# Patient Record
Sex: Male | Born: 1970 | Race: Black or African American | Hispanic: No | Smoking: Never smoker
Health system: Southern US, Community
[De-identification: ages and names within clinical notes are randomized; demographics above are authoritative.]

## PROBLEM LIST (undated history)

## (undated) DIAGNOSIS — G039 Meningitis, unspecified: Secondary | ICD-10-CM

## (undated) DIAGNOSIS — M25512 Pain in left shoulder: Secondary | ICD-10-CM

## (undated) DIAGNOSIS — E785 Hyperlipidemia, unspecified: Secondary | ICD-10-CM

## (undated) DIAGNOSIS — M25511 Pain in right shoulder: Secondary | ICD-10-CM

## (undated) DIAGNOSIS — E119 Type 2 diabetes mellitus without complications: Secondary | ICD-10-CM

## (undated) DIAGNOSIS — I10 Essential (primary) hypertension: Secondary | ICD-10-CM

## (undated) HISTORY — DX: Meningitis, unspecified: G03.9

## (undated) HISTORY — DX: Hyperlipidemia, unspecified: E78.5

## (undated) HISTORY — DX: Type 2 diabetes mellitus without complications: E11.9

## (undated) HISTORY — PX: EYE SURGERY: SHX253

---

## 2013-05-16 DIAGNOSIS — IMO0002 Reserved for concepts with insufficient information to code with codable children: Secondary | ICD-10-CM | POA: Insufficient documentation

## 2013-06-21 DIAGNOSIS — S63639A Sprain of interphalangeal joint of unspecified finger, initial encounter: Secondary | ICD-10-CM | POA: Insufficient documentation

## 2013-07-08 DIAGNOSIS — G039 Meningitis, unspecified: Secondary | ICD-10-CM

## 2013-07-08 HISTORY — DX: Meningitis, unspecified: G03.9

## 2013-11-17 ENCOUNTER — Emergency Department: Payer: Self-pay | Admitting: Emergency Medicine

## 2013-11-17 LAB — CSF CELL COUNT WITH DIFFERENTIAL
CSF TUBE #: 3
CSF Tube #: 1
EOS PCT: 0 %
EOS PCT: 0 %
Lymphocytes: 59 %
Lymphocytes: 67 %
MONOCYTES/MACROPHAGES: 41 %
Monocytes/Macrophages: 33 %
Neutrophils: 0 %
Neutrophils: 0 %
OTHER CELLS: 0 %
Other Cells: 0 %
RBC (CSF): 0 /mm3
RBC (CSF): 23 /mm3
WBC (CSF): 3 /mm3
WBC (CSF): 34 /mm3

## 2013-11-17 LAB — CBC
HCT: 39.2 % — ABNORMAL LOW (ref 40.0–52.0)
HGB: 12.8 g/dL — AB (ref 13.0–18.0)
MCH: 28 pg (ref 26.0–34.0)
MCHC: 32.6 g/dL (ref 32.0–36.0)
MCV: 86 fL (ref 80–100)
PLATELETS: 147 10*3/uL — AB (ref 150–440)
RBC: 4.57 10*6/uL (ref 4.40–5.90)
RDW: 13.4 % (ref 11.5–14.5)
WBC: 9.4 10*3/uL (ref 3.8–10.6)

## 2013-11-17 LAB — URINALYSIS, COMPLETE
BILIRUBIN, UR: NEGATIVE
BLOOD: NEGATIVE
Bacteria: NONE SEEN
Glucose,UR: 50 mg/dL (ref 0–75)
LEUKOCYTE ESTERASE: NEGATIVE
Nitrite: NEGATIVE
PH: 5 (ref 4.5–8.0)
Protein: NEGATIVE
RBC,UR: NONE SEEN /HPF (ref 0–5)
SQUAMOUS EPITHELIAL: NONE SEEN
Specific Gravity: 1.011 (ref 1.003–1.030)
WBC UR: NONE SEEN /HPF (ref 0–5)

## 2013-11-17 LAB — GLUCOSE, CSF: GLUCOSE, CSF: 110 mg/dL — AB (ref 40–75)

## 2013-11-17 LAB — COMPREHENSIVE METABOLIC PANEL
ALBUMIN: 3.2 g/dL — AB (ref 3.4–5.0)
ALT: 19 U/L (ref 12–78)
ANION GAP: 3 — AB (ref 7–16)
Alkaline Phosphatase: 61 U/L
BUN: 11 mg/dL (ref 7–18)
Bilirubin,Total: 0.4 mg/dL (ref 0.2–1.0)
CHLORIDE: 102 mmol/L (ref 98–107)
Calcium, Total: 8.5 mg/dL (ref 8.5–10.1)
Co2: 32 mmol/L (ref 21–32)
Creatinine: 1.2 mg/dL (ref 0.60–1.30)
EGFR (Non-African Amer.): >60
Glucose: 188 mg/dL — ABNORMAL HIGH (ref 65–99)
OSMOLALITY: 278 (ref 275–301)
POTASSIUM: 4 mmol/L (ref 3.5–5.1)
SGOT(AST): 21 U/L (ref 15–37)
SODIUM: 137 mmol/L (ref 136–145)
Total Protein: 7 g/dL (ref 6.4–8.2)

## 2013-11-17 LAB — PROTIME-INR
INR: 1.1
PROTHROMBIN TIME: 14.1 s (ref 11.5–14.7)

## 2013-11-17 LAB — PROTEIN, CSF: Protein, CSF: 42 mg/dL (ref 15–45)

## 2013-11-17 LAB — APTT: Activated PTT: 27 secs (ref 23.6–35.9)

## 2013-11-21 LAB — CSF CULTURE

## 2013-11-22 LAB — CULTURE, BLOOD (SINGLE)

## 2013-12-20 DIAGNOSIS — G039 Meningitis, unspecified: Secondary | ICD-10-CM | POA: Insufficient documentation

## 2014-04-01 DIAGNOSIS — S61019A Laceration without foreign body of unspecified thumb without damage to nail, initial encounter: Secondary | ICD-10-CM | POA: Insufficient documentation

## 2014-04-01 DIAGNOSIS — L84 Corns and callosities: Secondary | ICD-10-CM | POA: Insufficient documentation

## 2014-09-22 ENCOUNTER — Ambulatory Visit (INDEPENDENT_AMBULATORY_CARE_PROVIDER_SITE_OTHER): Payer: 59

## 2014-09-22 ENCOUNTER — Encounter: Payer: Self-pay | Admitting: Podiatry

## 2014-09-22 ENCOUNTER — Ambulatory Visit (INDEPENDENT_AMBULATORY_CARE_PROVIDER_SITE_OTHER): Payer: 59 | Admitting: Podiatry

## 2014-09-22 VITALS — BP 146/94 | HR 76 | Resp 16 | Ht 72.0 in | Wt 190.0 lb

## 2014-09-22 DIAGNOSIS — M79672 Pain in left foot: Secondary | ICD-10-CM | POA: Diagnosis not present

## 2014-09-22 DIAGNOSIS — Q828 Other specified congenital malformations of skin: Secondary | ICD-10-CM

## 2014-09-22 DIAGNOSIS — E119 Type 2 diabetes mellitus without complications: Secondary | ICD-10-CM | POA: Diagnosis not present

## 2014-09-22 DIAGNOSIS — M795 Residual foreign body in soft tissue: Secondary | ICD-10-CM

## 2014-09-22 NOTE — Progress Notes (Signed)
   Subjective:    Patient ID: Nathan Chan, male    DOB: [birth year], 44 y.o.   MRN: [id number]  HPI  44 year old male presents the office today with a painful callus on the bottom of his left foot on the ball of the foot which is present for several months. He presents today with his girlfriend who states that she. He tries to trim the area down. Denies any redness around the area or any drainage. He has diabetic for which she has been for the last 15 years. He denies any history of ulceration, tingling/numbness, claudication symptoms. States his last HbA1c was 12.0. No other complaints at this time.  Review of Systems  Endocrine:       Diabetes   All other systems reviewed and are negative.      Objective:   Physical Exam AAO 3, NAD DP/PT pulses palpable, CRT less than 3 seconds Protective sensation intact with Simms Weinstein monofilament, vibratory sensation intact, Achilles tendon reflex intact. Hyperkeratotic lesion left foot submetatarsal 4. There is tenderness upon direct palpation of this lesion. Upon debridement lesion there is no underlying ulceration, drainage or clinical signs of infection. No other open lesions or pre-ulcer lesions identified bilaterally. No interdigital maceration. No other areas of tenderness to bilateral lower extremities. No overlying edema, erythema, increase in warmth bilaterally. MMT 5/5, ROM WNL No pain with calf compression, swelling, warmth, erythema.       Assessment & Plan:  44 year old male with symptomatic hyperkeratotic lesion left foot -X-ray was obtained and reviewed with the patient. -Treatment options were discussed including alternatives, risks, complications. -Hyperkeratotic lesion sharply debrided 1 without complication/bleeding. -Discussed shoe gear modifications to help offload the area and various pads. -Follow-up in 3 months or sooner if any problems are to arise. In the meantime encouraged to call the office with any questions,  concerns, change in symptoms.

## 2014-09-22 NOTE — Patient Instructions (Signed)
Diabetes and Foot Care Diabetes may cause you to have problems because of poor blood supply (circulation) to your feet and legs. This may cause the skin on your feet to become thinner, break easier, and heal more slowly. Your skin may become dry, and the skin may peel and crack. You may also have nerve damage in your legs and feet causing decreased feeling in them. You may not notice minor injuries to your feet that could lead to infections or more serious problems. Taking care of your feet is one of the most important things you can do for yourself.  HOME CARE INSTRUCTIONS  Wear shoes at all times, even in the house. Do not go barefoot. Bare feet are easily injured.  Check your feet daily for blisters, cuts, and redness. If you cannot see the bottom of your feet, use a mirror or ask someone for help.  Wash your feet with warm water (do not use hot water) and mild soap. Then pat your feet and the areas between your toes until they are completely dry. Do not soak your feet as this can dry your skin.  Apply a moisturizing lotion or petroleum jelly (that does not contain alcohol and is unscented) to the skin on your feet and to dry, brittle toenails. Do not apply lotion between your toes.  Trim your toenails straight across. Do not dig under them or around the cuticle. File the edges of your nails with an emery board or nail file.  Do not cut corns or calluses or try to remove them with medicine.  Wear clean socks or stockings every day. Make sure they are not too tight. Do not wear knee-high stockings since they may decrease blood flow to your legs.  Wear shoes that fit properly and have enough cushioning. To break in new shoes, wear them for just a few hours a day. This prevents you from injuring your feet. Always look in your shoes before you put them on to be sure there are no objects inside.  Do not cross your legs. This may decrease the blood flow to your feet.  If you find a minor scrape,  cut, or break in the skin on your feet, keep it and the skin around it clean and dry. These areas may be cleansed with mild soap and water. Do not cleanse the area with peroxide, alcohol, or iodine.  When you remove an adhesive bandage, be sure not to damage the skin around it.  If you have a wound, look at it several times a day to make sure it is healing.  Do not use heating pads or hot water bottles. They may burn your skin. If you have lost feeling in your feet or legs, you may not know it is happening until it is too late.  Make sure your health care provider performs a complete foot exam at least annually or more often if you have foot problems. Report any cuts, sores, or bruises to your health care provider immediately. SEEK MEDICAL CARE IF:   You have an injury that is not healing.  You have cuts or breaks in the skin.  You have an ingrown nail.  You notice redness on your legs or feet.  You feel burning or tingling in your legs or feet.  You have pain or cramps in your legs and feet.  Your legs or feet are numb.  Your feet always feel cold. SEEK IMMEDIATE MEDICAL CARE IF:   There is increasing redness,   swelling, or pain in or around a wound.  There is a red line that goes up your leg.  Pus is coming from a wound.  You develop a fever or as directed by your health care provider.  You notice a bad smell coming from an ulcer or wound. Document Released: 06/21/2000 Document Revised: 02/24/2013 Document Reviewed: 12/01/2012 [employer] Patient Information 2015 [employer]. This information is not intended to replace advice given to you by your health care provider. Make sure you discuss any questions you have with your health care provider.  

## 2014-09-23 ENCOUNTER — Encounter: Payer: Self-pay | Admitting: Podiatry

## 2014-12-22 ENCOUNTER — Ambulatory Visit: Payer: 59 | Admitting: Podiatry

## 2015-04-17 DIAGNOSIS — M7580 Other shoulder lesions, unspecified shoulder: Secondary | ICD-10-CM | POA: Insufficient documentation

## 2015-05-14 ENCOUNTER — Emergency Department: Payer: Medicaid Other

## 2015-05-14 ENCOUNTER — Emergency Department
Admission: EM | Admit: 2015-05-14 | Discharge: 2015-05-14 | Disposition: A | Discharge status: Home / Self Care | Payer: Medicaid Other | Attending: Emergency Medicine | Admitting: Emergency Medicine

## 2015-05-14 ENCOUNTER — Encounter: Payer: Self-pay | Admitting: Emergency Medicine

## 2015-05-14 DIAGNOSIS — M5442 Lumbago with sciatica, left side: Secondary | ICD-10-CM | POA: Diagnosis not present

## 2015-05-14 DIAGNOSIS — Z7982 Long term (current) use of aspirin: Secondary | ICD-10-CM | POA: Insufficient documentation

## 2015-05-14 DIAGNOSIS — M4306 Spondylolysis, lumbar region: Secondary | ICD-10-CM | POA: Diagnosis not present

## 2015-05-14 DIAGNOSIS — E119 Type 2 diabetes mellitus without complications: Secondary | ICD-10-CM | POA: Diagnosis not present

## 2015-05-14 DIAGNOSIS — Z791 Long term (current) use of non-steroidal anti-inflammatories (NSAID): Secondary | ICD-10-CM | POA: Diagnosis not present

## 2015-05-14 DIAGNOSIS — Z794 Long term (current) use of insulin: Secondary | ICD-10-CM | POA: Diagnosis not present

## 2015-05-14 DIAGNOSIS — M79605 Pain in left leg: Secondary | ICD-10-CM | POA: Diagnosis present

## 2015-05-14 MED ORDER — KETOROLAC TROMETHAMINE 60 MG/2ML IM SOLN
60.0000 mg | Freq: Once | INTRAMUSCULAR | Status: AC
  Administered 2015-05-14: 60 mg via INTRAMUSCULAR
  Filled 2015-05-14: qty 2

## 2015-05-14 MED ORDER — ORPHENADRINE CITRATE 30 MG/ML IJ SOLN
60.0000 mg | INTRAMUSCULAR | Status: AC
  Administered 2015-05-14: 60 mg via INTRAMUSCULAR
  Filled 2015-05-14: qty 2

## 2015-05-14 MED ORDER — CYCLOBENZAPRINE HCL 5 MG PO TABS: 5.0000 mg | ORAL_TABLET | Freq: Three times a day (TID) | ORAL | Status: DC | PRN

## 2015-05-14 MED ORDER — KETOROLAC TROMETHAMINE 10 MG PO TABS: 10.0000 mg | ORAL_TABLET | Freq: Three times a day (TID) | ORAL | Status: DC

## 2015-05-14 MED ORDER — HYDROCODONE-ACETAMINOPHEN 5-325 MG PO TABS: 1.0000 | ORAL_TABLET | Freq: Three times a day (TID) | ORAL | Status: DC | PRN

## 2015-05-14 NOTE — Discharge Instructions (Signed)
Radicular Pain °Radicular pain in either the arm or leg is usually from a bulging or herniated disk in the spine. A piece of the herniated disk may press against the nerves as the nerves exit the spine. This causes pain which is felt at the tips of the nerves down the arm or leg. Other causes of radicular pain may include: °· Fractures. °· Heart disease. °· Cancer. °· An abnormal and usually degenerative state of the nervous system or nerves (neuropathy). °Diagnosis may require CT or MRI scanning to determine the primary cause.  °Nerves that start at the neck (nerve roots) may cause radicular pain in the outer shoulder and arm. It can spread down to the thumb and fingers. The symptoms vary depending on which nerve root has been affected. In most cases radicular pain improves with conservative treatment. Neck problems may require physical therapy, a neck collar, or cervical traction. Treatment may take many weeks, and surgery may be considered if the symptoms do not improve.  °Conservative treatment is also recommended for sciatica. Sciatica causes pain to radiate from the lower back or buttock area down the leg into the foot. Often there is a history of back problems. Most patients with sciatica are better after 2 to 4 weeks of rest and other supportive care. Short term bed rest can reduce the disk pressure considerably. Sitting, however, is not a good position since this increases the pressure on the disk. You should avoid bending, lifting, and all other activities which make the problem worse. Traction can be used in severe cases. Surgery is usually reserved for patients who do not improve within the first months of treatment. °Only take over-the-counter or prescription medicines for pain, discomfort, or fever as directed by your caregiver. Narcotics and muscle relaxants may help by relieving more severe pain and spasm and by providing mild sedation. Cold or massage can give significant relief. Spinal manipulation  is not recommended. It can increase the degree of disc protrusion. Epidural steroid injections are often effective treatment for radicular pain. These injections deliver medicine to the spinal nerve in the space between the protective covering of the spinal cord and back bones (vertebrae). Your caregiver can give you more information about steroid injections. These injections are most effective when given within two weeks of the onset of pain.  °You should see your caregiver for follow up care as recommended. A program for neck and back injury rehabilitation with stretching and strengthening exercises is an important part of management.  °SEEK IMMEDIATE MEDICAL CARE IF: °· You develop increased pain, weakness, or numbness in your arm or leg. °· You develop difficulty with bladder or bowel control. °· You develop abdominal pain. °  °This information is not intended to replace advice given to you by your health care provider. Make sure you discuss any questions you have with your health care provider. °  °Document Released: 08/01/2004 Document Revised: 07/15/2014 Document Reviewed: 01/18/2015 °Elsevier Interactive Patient Education ©2016 Elsevier [employer] ° °Sciatica °Sciatica is pain, weakness, numbness, or tingling along your sciatic nerve. The nerve starts in the lower back and runs down the back of each leg. Nerve damage or certain conditions pinch or put pressure on the sciatic nerve. This causes the pain, weakness, and other discomforts of sciatica. °HOME CARE  °· Only take medicine as told by your doctor. °· Apply ice to the affected area for 20 minutes. Do this 3-4 times a day for the first 48-72 hours. Then try heat in the same   way.  Exercise, stretch, or do your usual activities if these do not make your pain worse.  Go to physical therapy as told by your doctor.  Keep all doctor visits as told.  Do not wear high heels or shoes that are not supportive.  Get a firm mattress if your mattress is too soft  to lessen pain and discomfort. GET HELP RIGHT AWAY IF:   You cannot control when you poop (bowel movement) or pee (urinate).  You have more weakness in your lower back, lower belly (pelvis), butt (buttocks), or legs.  You have redness or puffiness (swelling) of your back.  You have a burning feeling when you pee.  You have pain that gets worse when you lie down.  You have pain that wakes you from your sleep.  Your pain is worse than past pain.  Your pain lasts longer than 4 weeks.  You are suddenly losing weight without reason. MAKE SURE YOU:   Understand these instructions.  Will watch this condition.  Will get help right away if you are not doing well or get worse.   This information is not intended to replace advice given to you by your health care provider. Make sure you discuss any questions you have with your health care provider.   Document Released: 04/02/2008 Document Revised: 03/15/2015 Document Reviewed: 11/03/2011 Elsevier Interactive Patient Education [employer].   Your back pain may be due to an underlying defect in the bones of your lumbar spine. You should take the prescription meds as directed.  Follow-up with your provider or the ortho specialist as referred.  Return to the ED for severe pain, numbness, or disability.

## 2015-05-14 NOTE — ED Notes (Signed)
Pt presents c/o achy pain to the outside of his left leg from hip socket down to ankle. Pt states pain has gotten worse, even with tylenol/motrin. Pt is diabetic. NAD noted.

## 2015-05-14 NOTE — ED Notes (Signed)
States he is having pain from left hip into leg   Ambulates with sl. Limp to triage

## 2015-05-14 NOTE — ED Provider Notes (Signed)
[facility] Emergency Department Provider Note ____________________________________________  Time seen: 1515  I have reviewed the triage vital signs and the nursing notes.  HISTORY  Chief Complaint  Leg Pain  HPI Nathan Chan is a 44 y.o. male reports to the ED for evaluation of management of low back pain with referral down the left leg, that has been ongoing for the last 1-1/2 months. The patient denies any injury, trauma, fall, but notes the pain has acutely worsened in the last 5-7 days. He denies any bladder or bowel incontinence, foot drop, or distal paresthesias. He describes the pain as achy and burning, and localizes it from the right buttocks down the dorsal lateral thigh, and down the posterior left calf. He denies any skin color or temperature changes or history of back problems or diabetic neuropathy. He does note that his pain is increased with standing and walking. He is to pain at a 7/10, and notes that it is constant. He has felt little relief with dosing Tylenol or ibuprofen. He has not been evaluated by his primary care provider for this complaint since the onset.  Past Medical History  Diagnosis Date  . Diabetes mellitus without complication ([place])    There are no active problems to display for this patient.  History reviewed. No pertinent past surgical history.  Current Outpatient Rx  Name  Route  Sig  Dispense  Refill  . aspirin 81 MG tablet   Oral   Take 81 mg by mouth daily.         . cyclobenzaprine (FLEXERIL) 5 MG tablet   Oral   Take 1 tablet (5 mg total) by mouth 3 (three) times daily as needed for muscle spasms.   15 tablet   0   . HYDROcodone-acetaminophen (NORCO) 5-325 MG tablet   Oral   Take 1 tablet by mouth every 8 (eight) hours as needed for moderate pain.   12 tablet   0   . insulin aspart protamine- aspart (NOVOLOG MIX 70/30) (70-30) 100 UNIT/ML injection   Subcutaneous   Inject into the skin.         Marland Kitchen  ketorolac (TORADOL) 10 MG tablet   Oral   Take 1 tablet (10 mg total) by mouth every 8 (eight) hours.   15 tablet   0    Allergies Review of patient's allergies indicates no known allergies.  No family history on file.  Social History Social History  Substance Use Topics  . Smoking status: Never Smoker   . Smokeless tobacco: None  . Alcohol Use: No   Review of Systems  Constitutional: Negative for fever. Eyes: Negative for visual changes. ENT: Negative for sore throat. Cardiovascular: Negative for chest pain. Respiratory: Negative for shortness of breath. Gastrointestinal: Negative for abdominal pain, vomiting and diarrhea. Genitourinary: Negative for dysuria. Musculoskeletal: Positive for back pain with LLE referral as above. Skin: Negative for rash. Neurological: Negative for headaches, focal weakness or numbness. ____________________________________________  PHYSICAL EXAM:  VITAL SIGNS: ED Triage Vitals  Enc Vitals Group     BP 05/14/15 1431 160/92 mmHg     Pulse Rate 05/14/15 1431 77     Resp 05/14/15 1431 18     Temp 05/14/15 1431 97.8 F (36.6 C)     Temp Source 05/14/15 1431 Oral     SpO2 05/14/15 1431 99 %     Weight 05/14/15 1431 190 lb (86.183 kg)     Height 05/14/15 1431 6' (1.829 m)  Head Cir --      Peak Flow --      Pain Score 05/14/15 1401 8     Pain Loc --      Pain Edu? --      Excl. in [place]? --    Constitutional: Alert and oriented. Well appearing and in no distress. Head: Normocephalic and atraumatic.      Eyes: Conjunctivae are normal. PERRL. Normal extraocular movements      Ears: Canals clear. TMs intact bilaterally.   Nose: No congestion/rhinorrhea.   Mouth/Throat: Mucous membranes are moist.   Neck: Supple. No thyromegaly. Hematological/Lymphatic/Immunological: No cervical lymphadenopathy. Cardiovascular: Normal rate, regular rhythm.  Respiratory: Normal respiratory effort. No wheezes/rales/rhonchi. Gastrointestinal:  Soft and nontender. No distention. Musculoskeletal: Normal spinal alignment without midline tenderness, spasm, deformity, or step-off. Patient with normal supine to sit difficulty.  Nontender with normal range of motion in all extremities.  Neurologic:  Normal gait without ataxia. Normal speech and language. No gross focal neurologic deficits are appreciated. Skin:  Skin is warm, dry and intact. No rash noted. Psychiatric: Mood and affect are normal. Patient exhibits appropriate insight and judgment. ____________________________________________   LABS (pertinent positives/negatives)  Labs Reviewed - No data to display  ____________________________________________   RADIOLOGY LS spine  FINDINGS: (per radiologist) Normal anatomic alignment. Preservation of the vertebral body and intervertebral disc space heights. Degenerative disc disease T11-12. No significant facet degenerative changes. No acute osseous Abnormality  On review of films, I find a pars defect at L5 whose age is unclear to me due to no previous imaging.   I, Keveon Amsler, Dannielle Karvonen, personally viewed and evaluated these images (plain radiographs) as part of my medical decision making. ____________________________________________  PROCEDURES  Toradol 60 mg PO Norflex 60 mg PO ____________________________________________  INITIAL IMPRESSION / ASSESSMENT AND PLAN / ED COURSE  Acute radicular lumbar pain with underlying pars defect. Suggest prescription med management with Toradol, Flexeril, adn Norco. Patient to follow-up with primary provider or Dr. Marry Guan for ongoing symptoms. Patient reports pain at 2/10 at discharge. ____________________________________________  FINAL CLINICAL IMPRESSION(S) / ED DIAGNOSES  Final diagnoses:  Left-sided low back pain with left-sided sciatica  Pars defect of lumbar spine      Melvenia Needles, PA-C 05/14/15 [place], MD 05/16/15 [phone]

## 2015-05-18 ENCOUNTER — Encounter: Payer: Self-pay | Admitting: Podiatry

## 2015-05-18 ENCOUNTER — Ambulatory Visit (INDEPENDENT_AMBULATORY_CARE_PROVIDER_SITE_OTHER): Payer: Medicaid Other | Admitting: Podiatry

## 2015-05-18 VITALS — BP 155/98 | HR 88 | Resp 18

## 2015-05-18 DIAGNOSIS — Q828 Other specified congenital malformations of skin: Secondary | ICD-10-CM | POA: Diagnosis not present

## 2015-05-21 ENCOUNTER — Encounter: Payer: Self-pay | Admitting: Podiatry

## 2015-05-21 DIAGNOSIS — Q828 Other specified congenital malformations of skin: Secondary | ICD-10-CM | POA: Insufficient documentation

## 2015-05-21 NOTE — Progress Notes (Addendum)
Patient ID: Nathan Chan, male   DOB: 1971/01/11, 44 y.o.   MRN: [id number]  Subjective: 44 y.o. returns the office today for painful "wart" on the bottom of the left foot which continues to be painful mostly with pressure. Denies any redness or drainage around the area. Denies any acute changes since last appointment and no new complaints today. Denies any systemic complaints such as fevers, chills, nausea, vomiting.   Objective: AAO 3, NAD DP/PT pulses palpable, CRT less than 3 seconds Protective sensation intact with Simms Weinstein monofilament Annular hyperkeratotic lesion left foot submetatarsal 4. Upon debridement there was no pinpoint bleeding and there is no evidence of verruca. This area appears to be a porokeratosis. There is no underlying ulceration, drainage or other signs of infection.  No open lesions or other pre-ulcerative lesions are identified. No other areas of tenderness bilateral lower extremities. No overlying edema, erythema, increased warmth. No pain with calf compression, swelling, warmth, erythema.  Assessment: Patient presents with symptomatic porokeratosis  Plan: -Treatment options including alternatives, risks, complications were discussed -Etiology of symptoms were discussed -Hyperkeratotic lesion sharply debrided 1 without, complications/bleeding. -Offloading pads dispensed.  Discussed this is going to be a likely reoccurring issue unless we can eliminate the pressure.  -Discussed daily foot inspection. If there are any changes, to call the office immediately.  -Follow-up in 3 months or sooner if any problems are to arise. In the meantime, encouraged to call the office with any questions, concerns, changes symptoms.  Celesta Gentile, DPM

## 2015-06-08 DIAGNOSIS — I10 Essential (primary) hypertension: Secondary | ICD-10-CM | POA: Insufficient documentation

## 2015-06-12 DIAGNOSIS — N529 Male erectile dysfunction, unspecified: Secondary | ICD-10-CM | POA: Insufficient documentation

## 2015-06-12 DIAGNOSIS — M24549 Contracture, unspecified hand: Secondary | ICD-10-CM | POA: Insufficient documentation

## 2015-06-15 ENCOUNTER — Emergency Department
Admission: EM | Admit: 2015-06-15 | Discharge: 2015-06-15 | Disposition: A | Discharge status: Home / Self Care | Payer: Medicaid Other | Attending: Emergency Medicine | Admitting: Emergency Medicine

## 2015-06-15 ENCOUNTER — Encounter: Payer: Self-pay | Admitting: *Deleted

## 2015-06-15 DIAGNOSIS — Z791 Long term (current) use of non-steroidal anti-inflammatories (NSAID): Secondary | ICD-10-CM | POA: Insufficient documentation

## 2015-06-15 DIAGNOSIS — Z7951 Long term (current) use of inhaled steroids: Secondary | ICD-10-CM | POA: Insufficient documentation

## 2015-06-15 DIAGNOSIS — Z7982 Long term (current) use of aspirin: Secondary | ICD-10-CM | POA: Diagnosis not present

## 2015-06-15 DIAGNOSIS — E119 Type 2 diabetes mellitus without complications: Secondary | ICD-10-CM | POA: Diagnosis not present

## 2015-06-15 DIAGNOSIS — J029 Acute pharyngitis, unspecified: Secondary | ICD-10-CM | POA: Diagnosis not present

## 2015-06-15 DIAGNOSIS — Z79899 Other long term (current) drug therapy: Secondary | ICD-10-CM | POA: Diagnosis not present

## 2015-06-15 DIAGNOSIS — Z794 Long term (current) use of insulin: Secondary | ICD-10-CM | POA: Diagnosis not present

## 2015-06-15 LAB — POCT RAPID STREP A: Streptococcus, Group A Screen (Direct): NEGATIVE

## 2015-06-15 MED ORDER — IBUPROFEN 800 MG PO TABS
800.0000 mg | ORAL_TABLET | Freq: Once | ORAL | Status: AC
  Administered 2015-06-15: 800 mg via ORAL
  Filled 2015-06-15: qty 1

## 2015-06-15 NOTE — Discharge Instructions (Signed)
Pharyngitis Pharyngitis is redness, pain, and swelling (inflammation) of your pharynx.  CAUSES  Pharyngitis is usually caused by infection. Most of the time, these infections are from viruses (viral) and are part of a cold. However, sometimes pharyngitis is caused by bacteria (bacterial). Pharyngitis can also be caused by allergies. Viral pharyngitis may be spread from person to person by coughing, sneezing, and personal items or utensils (cups, forks, spoons, toothbrushes). Bacterial pharyngitis may be spread from person to person by more intimate contact, such as kissing.  SIGNS AND SYMPTOMS  Symptoms of pharyngitis include:   Sore throat.   Tiredness (fatigue).   Low-grade fever.   Headache.  Joint pain and muscle aches.  Skin rashes.  Swollen lymph nodes.  Plaque-like film on throat or tonsils (often seen with bacterial pharyngitis). DIAGNOSIS  Your health care provider will ask you questions about your illness and your symptoms. Your medical history, along with a physical exam, is often all that is needed to diagnose pharyngitis. Sometimes, a rapid strep test is done. Other lab tests may also be done, depending on the suspected cause.  TREATMENT  Viral pharyngitis will usually get better in 3-4 days without the use of medicine. Bacterial pharyngitis is treated with medicines that kill germs (antibiotics).  HOME CARE INSTRUCTIONS   Drink enough water and fluids to keep your urine clear or pale yellow.   Only take over-the-counter or prescription medicines as directed by your health care provider:   If you are prescribed antibiotics, make sure you finish them even if you start to feel better.   Do not take aspirin.   Get lots of rest.   Gargle with 8 oz of salt water ( tsp of salt per 1 qt of water) as often as every 1-2 hours to soothe your throat.   Throat lozenges (if you are not at risk for choking) or sprays may be used to soothe your throat. SEEK MEDICAL  CARE IF:   You have large, tender lumps in your neck.  You have a rash.  You cough up green, yellow-brown, or bloody spit. SEEK IMMEDIATE MEDICAL CARE IF:   Your neck becomes stiff.  You drool or are unable to swallow liquids.  You vomit or are unable to keep medicines or liquids down.  You have severe pain that does not go away with the use of recommended medicines.  You have trouble breathing (not caused by a stuffy nose). MAKE SURE YOU:   Understand these instructions.  Will watch your condition.  Will get help right away if you are not doing well or get worse.   This information is not intended to replace advice given to you by your health care provider. Make sure you discuss any questions you have with your health care provider.   Document Released: 06/24/2005 Document Revised: 04/14/2013 Document Reviewed: 03/01/2013 Elsevier Interactive Patient Education 2016 [place] ibuprofen as needed for pain. He may also use over-the-counter cold medicines such as mucinex or corcidin.   Follow-up with urgent care if not improving. Return to emergency room for any concerns.

## 2015-06-15 NOTE — ED Notes (Signed)
Pt complains of sore throat since this morning , pt denies any other symptoms

## 2015-06-15 NOTE — ED Provider Notes (Signed)
[facility] Emergency Department Provider Note  ____________________________________________  Time seen: Approximately 3:43 PM  I have reviewed the triage vital signs and the nursing notes.   HISTORY  Chief Complaint Sore Throat    HPI Nathan Chan is a 44 y.o. male who presents with acute sore throat beginning today. He has mild congestion and cough. No known exposure to strep throat. He has mild headache. No current fevers. No shortness of breath or chest pain.   Past Medical History  Diagnosis Date  . Diabetes mellitus without complication [facility])     Patient Active Problem List   Diagnosis Date Noted  . Porokeratosis 05/21/2015    History reviewed. No pertinent past surgical history.  Current Outpatient Rx  Name  Route  Sig  Dispense  Refill  . albuterol (PROAIR HFA) 108 (90 BASE) MCG/ACT inhaler   Inhalation   Inhale into the lungs.         Marland Kitchen aspirin 81 MG tablet   Oral   Take 81 mg by mouth daily.         Marland Kitchen aspirin EC 81 MG tablet   Oral   Take 81 mg by mouth.         Marland Kitchen atorvastatin (LIPITOR) 20 MG tablet   Oral   Take 20 mg by mouth.         . carisoprodol (SOMA) 350 MG tablet   Oral   Take 350 mg by mouth.         . cyclobenzaprine (FLEXERIL) 5 MG tablet   Oral   Take 1 tablet (5 mg total) by mouth 3 (three) times daily as needed for muscle spasms.   15 tablet   0   . HYDROcodone-acetaminophen (NORCO) 5-325 MG tablet   Oral   Take 1 tablet by mouth every 8 (eight) hours as needed for moderate pain.   12 tablet   0   . insulin aspart (NOVOLOG FLEXPEN) 100 UNIT/ML FlexPen      Take 8 units with breakfast and dinner, 4 units at lunch.         . insulin aspart protamine - aspart (NOVOLOG 70/30 MIX) (70-30) 100 UNIT/ML FlexPen      35 units in the am and 15 units in the pm subcutaneous         . insulin aspart protamine- aspart (NOVOLOG MIX 70/30) (70-30) 100 UNIT/ML injection   Subcutaneous   Inject  into the skin.         Marland Kitchen ketorolac (TORADOL) 10 MG tablet   Oral   Take 1 tablet (10 mg total) by mouth every 8 (eight) hours.   15 tablet   0   . lisinopril (PRINIVIL,ZESTRIL) 10 MG tablet   Oral   Take 10 mg by mouth.         . meloxicam (MOBIC) 15 MG tablet   Oral   Take 15 mg by mouth.         . naproxen (NAPROSYN) 500 MG tablet   Oral   Take by mouth.         . traMADol (ULTRAM) 50 MG tablet      1-2 tablets every 6 hours as needed for pain.           Allergies Review of patient's allergies indicates no known allergies.  No family history on file.  Social History Social History  Substance Use Topics  . Smoking status: Never Smoker   . Smokeless tobacco: None  .  Alcohol Use: No    Review of Systems Constitutional: No fever/chills Eyes: No visual changes. ENT: No ear pain or sinus pressure.. Cardiovascular: Denies chest pain. Respiratory: Denies shortness of breath. Gastrointestinal: No abdominal pain.  No nausea, no vomiting.  No diarrhea.  No constipation. Neurological: Negative focal weakness or numbness. 10-point ROS otherwise negative.  ____________________________________________   PHYSICAL EXAM:  VITAL SIGNS: ED Triage Vitals  Enc Vitals Group     BP 06/15/15 1503 133/94 mmHg     Pulse Rate 06/15/15 1503 84     Resp 06/15/15 1503 16     Temp 06/15/15 1503 98.2 F (36.8 C)     Temp Source 06/15/15 1503 Oral     SpO2 06/15/15 1503 97 %     Weight 06/15/15 1503 191 lb (86.637 kg)     Height 06/15/15 1503 6' (1.829 m)     Head Cir --      Peak Flow --      Pain Score 06/15/15 1508 6     Pain Loc --      Pain Edu? --      Excl. in [place]? --     Constitutional: Alert and oriented. Well appearing and in no acute distress. Eyes: Conjunctivae are normal. PERRL. EOMI. Ears:  Clear with normal landmarks. No erythema. Head: Atraumatic. Nose: No congestion/rhinnorhea. Mouth/Throat: Mucous membranes are moist.  Oropharynx  non-erythematous. No lesions. Neck:  Supple.  Cervical and tonsilar adenopathy.   Cardiovascular: Normal rate, regular rhythm. Grossly normal heart sounds.  Good peripheral circulation. Respiratory: Normal respiratory effort.  No retractions. Lungs CTAB. Gastrointestinal: Soft and nontender. No distention. No abdominal bruits. No CVA tenderness. Musculoskeletal: Nml ROM of upper and lower extremity joints. Neurologic:  Normal speech and language. No gross focal neurologic deficits are appreciated. No gait instability. Skin:  Skin is warm, dry and intact. No rash noted. Psychiatric: Mood and affect are normal. Speech and behavior are normal.  ____________________________________________   LABS (all labs ordered are listed, but only abnormal results are displayed)  Labs Reviewed - No data to display ____________________________________________  EKG   ____________________________________________  RADIOLOGY   ____________________________________________   PROCEDURES  Procedure(s) performed: None  Critical Care performed: No  ____________________________________________   INITIAL IMPRESSION / ASSESSMENT AND PLAN / ED COURSE  Pertinent labs & imaging results that were available during my care of the patient were reviewed by me and considered in my medical decision making (see chart for details).  44 year old who presents with 1 day of sore throat. Suspect a viral pharyngitis. Given ibuprofen. May use over-the-counter medicines as needed. Will follow-up with urgent care if not improving. ____________________________________________   FINAL CLINICAL IMPRESSION(S) / ED DIAGNOSES  Final diagnoses:  Pharyngitis      [name], PA-C 06/15/15 1609  Hinda Kehr, MD 06/16/15 [id number]

## 2015-06-18 LAB — CULTURE, GROUP A STREP (THRC)

## 2015-07-20 ENCOUNTER — Emergency Department
Admission: EM | Admit: 2015-07-20 | Discharge: 2015-07-20 | Disposition: A | Discharge status: Home / Self Care | Payer: Medicaid Other | Attending: Emergency Medicine | Admitting: Emergency Medicine

## 2015-07-20 ENCOUNTER — Encounter: Payer: Self-pay | Admitting: *Deleted

## 2015-07-20 DIAGNOSIS — Z79899 Other long term (current) drug therapy: Secondary | ICD-10-CM | POA: Insufficient documentation

## 2015-07-20 DIAGNOSIS — Z791 Long term (current) use of non-steroidal anti-inflammatories (NSAID): Secondary | ICD-10-CM | POA: Diagnosis not present

## 2015-07-20 DIAGNOSIS — E119 Type 2 diabetes mellitus without complications: Secondary | ICD-10-CM | POA: Diagnosis not present

## 2015-07-20 DIAGNOSIS — H538 Other visual disturbances: Secondary | ICD-10-CM | POA: Diagnosis not present

## 2015-07-20 DIAGNOSIS — Z794 Long term (current) use of insulin: Secondary | ICD-10-CM | POA: Insufficient documentation

## 2015-07-20 DIAGNOSIS — H109 Unspecified conjunctivitis: Secondary | ICD-10-CM | POA: Diagnosis not present

## 2015-07-20 DIAGNOSIS — Z7982 Long term (current) use of aspirin: Secondary | ICD-10-CM | POA: Diagnosis not present

## 2015-07-20 DIAGNOSIS — H539 Unspecified visual disturbance: Secondary | ICD-10-CM

## 2015-07-20 DIAGNOSIS — H5711 Ocular pain, right eye: Secondary | ICD-10-CM | POA: Diagnosis present

## 2015-07-20 MED ORDER — TOBRAMYCIN 0.3 % OP SOLN
2.0000 [drp] | OPHTHALMIC | Status: DC
  Filled 2015-07-20: qty 5

## 2015-07-20 MED ORDER — FLUORESCEIN SODIUM 1 MG OP STRP
1.0000 | ORAL_STRIP | Freq: Once | OPHTHALMIC | Status: DC
  Filled 2015-07-20: qty 1

## 2015-07-20 MED ORDER — PROPARACAINE HCL 0.5 % OP SOLN
1.0000 [drp] | Freq: Once | OPHTHALMIC | Status: DC
  Filled 2015-07-20: qty 15

## 2015-07-20 MED ORDER — EYE WASH OPHTH SOLN
OPHTHALMIC | Status: AC
  Filled 2015-07-20: qty 118

## 2015-07-20 MED ORDER — GENTAMICIN SULFATE 0.3 % OP SOLN: 1.0000 [drp] | Freq: Four times a day (QID) | OPHTHALMIC | Status: AC

## 2015-07-20 MED ORDER — TETRACAINE HCL 0.5 % OP SOLN
OPHTHALMIC | Status: AC
  Filled 2015-07-20: qty 2

## 2015-07-20 MED ORDER — EYE WASH OPHTH SOLN: 1.0000 [drp] | OPHTHALMIC | Status: DC | PRN

## 2015-07-20 MED ORDER — ERYTHROMYCIN 5 MG/GM OP OINT
TOPICAL_OINTMENT | Freq: Once | OPHTHALMIC | Status: AC
Start: 2015-07-20 — End: 2015-07-20
  Administered 2015-07-20: 1 via OPHTHALMIC
  Filled 2015-07-20: qty 1

## 2015-07-20 NOTE — ED Notes (Signed)
Pt states right eye pain for 2 days, redness noted to eye, deneis any headache or dizziness

## 2015-07-20 NOTE — ED Notes (Signed)
Assess per PA 

## 2015-07-20 NOTE — ED Provider Notes (Signed)
[facility] Emergency Department Provider Note  ____________________________________________  Time seen: Approximately 5:21 PM  I have reviewed the triage vital signs and the nursing notes.   HISTORY  Chief Complaint Eye Pain    HPI Nathan Chan is a 45 y.o. male sense with 2 days of right eye irritation, and mild blurring of vision.  He reports head congestion for several weeks.  No light sensitivity or severe eye pain.  Able to drive without difficulty.  Has occasional foreign body sensation.  No n/v, HA. No hx of glaucoma. No itching or rash.   Past Medical History  Diagnosis Date  . Diabetes mellitus without complication [facility])     Patient Active Problem List   Diagnosis Date Noted  . Porokeratosis 05/21/2015    History reviewed. No pertinent past surgical history.  Current Outpatient Rx  Name  Route  Sig  Dispense  Refill  . EXPIRED: albuterol (PROAIR HFA) 108 (90 BASE) MCG/ACT inhaler   Inhalation   Inhale into the lungs.         Marland Kitchen aspirin 81 MG tablet   Oral   Take 81 mg by mouth daily.         Marland Kitchen aspirin EC 81 MG tablet   Oral   Take 81 mg by mouth.         Marland Kitchen atorvastatin (LIPITOR) 20 MG tablet   Oral   Take 20 mg by mouth.         . carisoprodol (SOMA) 350 MG tablet   Oral   Take 350 mg by mouth.         . cyclobenzaprine (FLEXERIL) 5 MG tablet   Oral   Take 1 tablet (5 mg total) by mouth 3 (three) times daily as needed for muscle spasms.   15 tablet   0   . HYDROcodone-acetaminophen (NORCO) 5-325 MG tablet   Oral   Take 1 tablet by mouth every 8 (eight) hours as needed for moderate pain.   12 tablet   0   . insulin aspart (NOVOLOG FLEXPEN) 100 UNIT/ML FlexPen      Take 8 units with breakfast and dinner, 4 units at lunch.         . insulin aspart protamine - aspart (NOVOLOG 70/30 MIX) (70-30) 100 UNIT/ML FlexPen      35 units in the am and 15 units in the pm subcutaneous         . insulin aspart  protamine- aspart (NOVOLOG MIX 70/30) (70-30) 100 UNIT/ML injection   Subcutaneous   Inject into the skin.         Marland Kitchen ketorolac (TORADOL) 10 MG tablet   Oral   Take 1 tablet (10 mg total) by mouth every 8 (eight) hours.   15 tablet   0   . lisinopril (PRINIVIL,ZESTRIL) 10 MG tablet   Oral   Take 10 mg by mouth.         . meloxicam (MOBIC) 15 MG tablet   Oral   Take 15 mg by mouth.         . naproxen (NAPROSYN) 500 MG tablet   Oral   Take by mouth.         . traMADol (ULTRAM) 50 MG tablet      1-2 tablets every 6 hours as needed for pain.           Allergies Review of patient's allergies indicates no known allergies.  History reviewed. No pertinent family history.  Social History Social History  Substance Use Topics  . Smoking status: Never Smoker   . Smokeless tobacco: None  . Alcohol Use: No    Review of Systems Constitutional: No fever/chills Eyes: per HPI ENT: No sore throat. Cardiovascular: Denies chest pain. Respiratory: Denies shortness of breath. Gastrointestinal: No abdominal pain.  No nausea, no vomiting.  No diarrhea.  No constipation. Skin: Negative for rash. Neurological: Negative for headaches, focal weakness or numbness. 10-point ROS otherwise negative.  ____________________________________________   PHYSICAL EXAM:  VITAL SIGNS: ED Triage Vitals  Enc Vitals Group     BP 07/20/15 1456 166/107 mmHg     Pulse Rate 07/20/15 1456 89     Resp 07/20/15 1456 18     Temp 07/20/15 1456 98.1 F (36.7 C)     Temp Source 07/20/15 1456 Oral     SpO2 07/20/15 1456 98 %     Weight 07/20/15 1456 195 lb (88.451 kg)     Height 07/20/15 1456 6' (1.829 m)     Head Cir --      Peak Flow --      Pain Score 07/20/15 1457 7     Pain Loc --      Pain Edu? --      Excl. in [place]? --     Constitutional: Alert and oriented. Well appearing and in no acute distress. Eyes: Conjunctivae RIGHT, Mildly injected. PERRL. EOMI. Right eye exam:  With slit  lamp. No abrasions, foreign bodies noted.  Inspected under the lids as well.  Used fluorescein strip with no abrasions noted.  Irrigation performed 64ml.  Also used tetracaine for the exam. Ears:  Clear with normal landmarks. No erythema. Head: Atraumatic. Nose: No congestion/rhinnorhea. Mouth/Throat: Mucous membranes are moist.  Oropharynx non-erythematous. No lesions. Neck:  Supple.  No adenopathy.   Cardiovascular: Normal rate, regular rhythm. Grossly normal heart sounds.  Good peripheral circulation. Respiratory: Normal respiratory effort.  No retractions. Lungs CTAB. Gastrointestinal: Soft and nontender. No distention. No abdominal bruits. No CVA tenderness. Psychiatric: Mood and affect are normal. Speech and behavior are normal.  ____________________________________________   LABS (all labs ordered are listed, but only abnormal results are displayed)  Labs Reviewed - No data to display ____________________________________________  EKG    ____________________________________________  RADIOLOGY   ____________________________________________   PROCEDURES  Procedure(s) performed: see eye evaluation above  Critical Care performed: No  ____________________________________________   INITIAL IMPRESSION / ASSESSMENT AND PLAN / ED COURSE  Pertinent labs & imaging results that were available during my care of the patient were reviewed by me and considered in my medical decision making (see chart for details).  -45 year-old male with mild redness, discomfort and blurring of vision to the right eye.  Normal eye exam except for visual acuity 20/70 in right eye and 20/40 in left.   Instructed in using tobramycin eye drops for possible conjunctivitis and follow up with the ophthalmologist tomorrow for further evaluation if not improving.  He will return the ER for any worsening symptoms such as worsening eye pain or visual loss   He has hx of hypertension taking BP meds BID.   Encouraged taking his BP meds as soon as he gets home. ____________________________________________   FINAL CLINICAL IMPRESSION(S) / ED DIAGNOSES  Final diagnoses:  Vision changes  Conjunctivitis of right eye      [name], PA-C 07/20/15 1734  Harvest Dark, MD 07/20/15 2309

## 2015-07-20 NOTE — Discharge Instructions (Signed)
° ° ° ° °  Visual Disturbances     You have had a disturbance in your vision. This may be caused by various conditions, such as:  Migraines. Migraine headaches are often preceded by a disturbance in vision. Blind spots or light flashes are followed by a headache. This type of visual disturbance is temporary. It does not damage the eye.  Glaucoma. This is caused by increased pressure in the eye. Symptoms include haziness, blurred vision, or seeing rainbow colored circles when looking at bright lights. Partial or complete visual loss can occur. You may or may not experience eye pain. Visual loss may be gradual or sudden and is irreversible. Glaucoma is the leading cause of blindness.  Retina problems. Vision will be reduced if the retina becomes detached or if there is a circulation problem as with diabetes, high blood pressure, or a mini-stroke. Symptoms include seeing "floaters," flashes of light, or shadows, as if a curtain has fallen over your eye.  Optic nerve problems. The main nerve in your eye can be damaged by redness, soreness, and swelling (inflammation), poor circulation, drugs, and toxins. It is very important to have a complete exam done by a specialist to determine the exact cause of your eye problem. The specialist may recommend medicines or surgery, depending on the cause of the problem. This can help prevent further loss of vision or reduce the risk of having a stroke. Contact the caregiver to whom you have been referred and arrange for follow-up care right away. SEEK IMMEDIATE MEDICAL CARE IF:   Your vision gets worse.  You develop severe headaches.  You have any weakness or numbness in the face, arms, or legs.  You have any trouble speaking or walking.   This information is not intended to replace advice given to you by your health care provider. Make sure you discuss any questions you have with your health care provider.   Document Released: 08/01/2004 Document Revised:  09/16/2011 Document Reviewed: 12/01/2013 Elsevier Interactive Patient Education [employer].   Antibiotic eyedrops as directed. Contact the ophthalmologist tomorrow if not improving. Return to the emergency room for any concerns.

## 2015-07-25 ENCOUNTER — Ambulatory Visit: Payer: 59 | Admitting: Podiatry

## 2015-07-27 ENCOUNTER — Ambulatory Visit: Payer: Medicaid Other | Admitting: Podiatry

## 2015-09-06 DIAGNOSIS — E113599 Type 2 diabetes mellitus with proliferative diabetic retinopathy without macular edema, unspecified eye: Secondary | ICD-10-CM | POA: Insufficient documentation

## 2015-09-06 HISTORY — PX: EYE SURGERY: SHX253

## 2015-09-28 DIAGNOSIS — R809 Proteinuria, unspecified: Secondary | ICD-10-CM | POA: Insufficient documentation

## 2016-01-18 ENCOUNTER — Emergency Department: Payer: BLUE CROSS/BLUE SHIELD

## 2016-01-18 ENCOUNTER — Emergency Department
Admission: EM | Admit: 2016-01-18 | Discharge: 2016-01-18 | Disposition: A | Discharge status: Home / Self Care | Payer: BLUE CROSS/BLUE SHIELD | Attending: Emergency Medicine | Admitting: Emergency Medicine

## 2016-01-18 ENCOUNTER — Encounter: Payer: Self-pay | Admitting: Emergency Medicine

## 2016-01-18 DIAGNOSIS — Z79899 Other long term (current) drug therapy: Secondary | ICD-10-CM | POA: Diagnosis not present

## 2016-01-18 DIAGNOSIS — K529 Noninfective gastroenteritis and colitis, unspecified: Secondary | ICD-10-CM | POA: Diagnosis not present

## 2016-01-18 DIAGNOSIS — R0781 Pleurodynia: Secondary | ICD-10-CM

## 2016-01-18 DIAGNOSIS — I10 Essential (primary) hypertension: Secondary | ICD-10-CM | POA: Diagnosis not present

## 2016-01-18 DIAGNOSIS — E119 Type 2 diabetes mellitus without complications: Secondary | ICD-10-CM | POA: Diagnosis not present

## 2016-01-18 DIAGNOSIS — Z794 Long term (current) use of insulin: Secondary | ICD-10-CM | POA: Insufficient documentation

## 2016-01-18 DIAGNOSIS — Z7982 Long term (current) use of aspirin: Secondary | ICD-10-CM | POA: Insufficient documentation

## 2016-01-18 DIAGNOSIS — R1031 Right lower quadrant pain: Secondary | ICD-10-CM | POA: Diagnosis present

## 2016-01-18 HISTORY — DX: Essential (primary) hypertension: I10

## 2016-01-18 LAB — CBC
HCT: 44.4 % (ref 40.0–52.0)
HEMOGLOBIN: 14.5 g/dL (ref 13.0–18.0)
MCH: 28 pg (ref 26.0–34.0)
MCHC: 32.7 g/dL (ref 32.0–36.0)
MCV: 85.7 fL (ref 80.0–100.0)
PLATELETS: 161 10*3/uL (ref 150–440)
RBC: 5.18 MIL/uL (ref 4.40–5.90)
RDW: 14.4 % (ref 11.5–14.5)
WBC: 3.3 10*3/uL — AB (ref 3.8–10.6)

## 2016-01-18 LAB — DIFFERENTIAL
Basophils Absolute: 0.1 10*3/uL (ref 0–0.1)
Basophils Relative: 2 %
EOS PCT: 2 %
Eosinophils Absolute: 0 10*3/uL (ref 0–0.7)
LYMPHS ABS: 1.4 10*3/uL (ref 1.0–3.6)
LYMPHS PCT: 42 %
MONO ABS: 0.3 10*3/uL (ref 0.2–1.0)
Monocytes Relative: 9 %
NEUTROS ABS: 1.5 10*3/uL (ref 1.4–6.5)
Neutrophils Relative %: 45 %

## 2016-01-18 LAB — URINALYSIS COMPLETE WITH MICROSCOPIC (ARMC ONLY)
Bacteria, UA: NONE SEEN
Bilirubin Urine: NEGATIVE
KETONES UR: NEGATIVE mg/dL
Leukocytes, UA: NEGATIVE
Nitrite: NEGATIVE
Protein, ur: 100 mg/dL — AB
SPECIFIC GRAVITY, URINE: 1.029 (ref 1.005–1.030)
SQUAMOUS EPITHELIAL / LPF: NONE SEEN
pH: 5 (ref 5.0–8.0)

## 2016-01-18 LAB — COMPREHENSIVE METABOLIC PANEL
ALK PHOS: 61 U/L (ref 38–126)
ALT: 23 U/L (ref 17–63)
ANION GAP: 8 (ref 5–15)
AST: 20 U/L (ref 15–41)
Albumin: 4.5 g/dL (ref 3.5–5.0)
BUN: 27 mg/dL — ABNORMAL HIGH (ref 6–20)
CALCIUM: 9.8 mg/dL (ref 8.9–10.3)
CO2: 28 mmol/L (ref 22–32)
Chloride: 103 mmol/L (ref 101–111)
Creatinine, Ser: 1.17 mg/dL (ref 0.61–1.24)
Glucose, Bld: 107 mg/dL — ABNORMAL HIGH (ref 65–99)
Potassium: 4.1 mmol/L (ref 3.5–5.1)
SODIUM: 139 mmol/L (ref 135–145)
Total Bilirubin: 0.4 mg/dL (ref 0.3–1.2)
Total Protein: 7.7 g/dL (ref 6.5–8.1)

## 2016-01-18 LAB — GLUCOSE, CAPILLARY
Glucose-Capillary: 45 mg/dL — ABNORMAL LOW (ref 65–99)
Glucose-Capillary: 63 mg/dL — ABNORMAL LOW (ref 65–99)
Glucose-Capillary: 83 mg/dL (ref 65–99)

## 2016-01-18 LAB — LIPASE, BLOOD: LIPASE: 17 U/L (ref 11–51)

## 2016-01-18 MED ORDER — IOPAMIDOL (ISOVUE-300) INJECTION 61%
100.0000 mL | Freq: Once | INTRAVENOUS | Status: AC | PRN
  Administered 2016-01-18: 100 mL via INTRAVENOUS

## 2016-01-18 MED ORDER — DIATRIZOATE MEGLUMINE & SODIUM 66-10 % PO SOLN
15.0000 mL | Freq: Once | ORAL | Status: AC
  Administered 2016-01-18: 15 mL via ORAL

## 2016-01-18 NOTE — ED Notes (Signed)
Pt presents with RLQ abdominal pain for approximately one month. Pt reports pain has been increasing. Pt denies nausea, vomiting or diarrhea.

## 2016-01-18 NOTE — ED Provider Notes (Signed)
[facility] Emergency Department Provider Note   ____________________________________________  Time seen: Approximately 3:55 PM  I have reviewed the triage vital signs and the nursing notes.   HISTORY  Chief Complaint Abdominal Pain   HPI Nathan Chan is a 45 y.o. male who reports 1 month of right sided flank and abdominal pain. The pain is sharp and constant. It's worse if he takes a deep breath. He denies any nausea vomiting diarrhea fever chills he does have an occasional cough. Said the pain is been getting worse recently. He said he usually maintains his blood sugars between 1 and 200. Lately he showed up in 3-400.Pain is worse with deep breathing. Patient has no dysuria urgency or frequency Past Medical History  Diagnosis Date  . Diabetes mellitus without complication ([place])   . Hypertension     Patient Active Problem List   Diagnosis Date Noted  . Porokeratosis 05/21/2015    Past Surgical History  Procedure Laterality Date  . Eye surgery      Current Outpatient Rx  Name  Route  Sig  Dispense  Refill  . EXPIRED: albuterol (PROAIR HFA) 108 (90 BASE) MCG/ACT inhaler   Inhalation   Inhale into the lungs.         Marland Kitchen aspirin 81 MG tablet   Oral   Take 81 mg by mouth daily.         Marland Kitchen EXPIRED: atorvastatin (LIPITOR) 20 MG tablet   Oral   Take 20 mg by mouth daily at 6 PM.          . carisoprodol (SOMA) 350 MG tablet   Oral   Take 350 mg by mouth at bedtime.          . cyclobenzaprine (FLEXERIL) 5 MG tablet   Oral   Take 1 tablet (5 mg total) by mouth 3 (three) times daily as needed for muscle spasms.   15 tablet   0   . HYDROcodone-acetaminophen (NORCO) 5-325 MG tablet   Oral   Take 1 tablet by mouth every 8 (eight) hours as needed for moderate pain.   12 tablet   0   . insulin aspart (NOVOLOG FLEXPEN) 100 UNIT/ML FlexPen      Take 8 units with breakfast and dinner, 4 units at lunch.         . insulin aspart  protamine - aspart (NOVOLOG 70/30 MIX) (70-30) 100 UNIT/ML FlexPen      35 units in the am and 15 units in the pm subcutaneous         . ketorolac (TORADOL) 10 MG tablet   Oral   Take 1 tablet (10 mg total) by mouth every 8 (eight) hours.   15 tablet   0   . EXPIRED: lisinopril (PRINIVIL,ZESTRIL) 10 MG tablet   Oral   Take 10 mg by mouth daily.          . meloxicam (MOBIC) 15 MG tablet   Oral   Take 15 mg by mouth daily.          . naproxen (NAPROSYN) 500 MG tablet   Oral   Take 500 mg by mouth 2 (two) times daily with a meal.          . traMADol (ULTRAM) 50 MG tablet      1-2 tablets every 6 hours as needed for pain.           Allergies Review of patient's allergies indicates no known allergies.  No  family history on file.  Social History Social History  Substance Use Topics  . Smoking status: Never Smoker   . Smokeless tobacco: None  . Alcohol Use: No    Review of Systems Constitutional: No fever/chills Eyes: No visual changes. ENT: No sore throat. Cardiovascular: Denies chest pain. Respiratory: Denies shortness of breath. Gastrointestinal:See history of present illness. Genitourinary: Negative for dysuria. Musculoskeletal: Negative for back pain. Skin: Negative for rash. Neurological: Negative for headaches, focal weakness or numbness.  10-point ROS otherwise negative.  ____________________________________________   PHYSICAL EXAM:  VITAL SIGNS: ED Triage Vitals  Enc Vitals Group     BP 01/18/16 1412 135/83 mmHg     Pulse Rate 01/18/16 1412 93     Resp 01/18/16 1412 18     Temp 01/18/16 1412 98.6 F (37 C)     Temp Source 01/18/16 1412 Oral     SpO2 01/18/16 1412 98 %     Weight 01/18/16 1412 199 lb (90.266 kg)     Height 01/18/16 1412 6' (1.829 m)     Head Cir --      Peak Flow --      Pain Score 01/18/16 1420 6     Pain Loc --      Pain Edu? --      Excl. in [place]? --     Constitutional: Alert and oriented. Well appearing and  in no acute distress. Eyes: Conjunctivae are normal. PERRL. EOMI. Head: Atraumatic. Nose: No congestion/rhinnorhea. Mouth/Throat: Mucous membranes are moist.  Oropharynx non-erythematous. Neck: No stridor.   Cardiovascular: Normal rate, regular rhythm. Grossly normal heart sounds.  Good peripheral circulation. Respiratory: Normal respiratory effort.  No retractions. Lungs CTAB. Gastrointestinal: Soft mild tenderness in the upper right quadrant of the abdomen. She is really not tender right lower quadrant. No distention. No abdominal bruits. Patient has a little bit of right CVA tenderness no left CVA tenderness }Musculoskeletal: No lower extremity tenderness nor edema.  No joint effusions. Neurologic:  Normal speech and language. No gross focal neurologic deficits are appreciated. No gait instability. Skin:  Skin is warm, dry and intact. No rash noted. Psychiatric: Mood and affect are normal. Speech and behavior are normal.  ____________________________________________   LABS (all labs ordered are listed, but only abnormal results are displayed)  Labs Reviewed  COMPREHENSIVE METABOLIC PANEL - Abnormal; Notable for the following:    Glucose, Bld 107 (*)    BUN 27 (*)    All other components within normal limits  CBC - Abnormal; Notable for the following:    WBC 3.3 (*)    All other components within normal limits  URINALYSIS COMPLETEWITH MICROSCOPIC ([facility] ONLY) - Abnormal; Notable for the following:    Color, Urine YELLOW (*)    APPearance CLEAR (*)    Glucose, UA >500 (*)    Hgb urine dipstick 1+ (*)    Protein, ur 100 (*)    All other components within normal limits  GLUCOSE, CAPILLARY - Abnormal; Notable for the following:    Glucose-Capillary 45 (*)    All other components within normal limits  GLUCOSE, CAPILLARY - Abnormal; Notable for the following:    Glucose-Capillary 63 (*)    All other components within normal limits  LIPASE, BLOOD  DIFFERENTIAL  GLUCOSE, CAPILLARY    ____________________________________________  EKG   ____________________________________________  RADIOLOGY  CLINICAL DATA: Right upper quadrant abdominal pain for 1 month. Diabetes.  EXAM: CT ABDOMEN AND PELVIS WITH CONTRAST  TECHNIQUE: Multidetector CT imaging of  the abdomen and pelvis was performed using the standard protocol following bolus administration of intravenous contrast.  CONTRAST: 1109mL ISOVUE-300 IOPAMIDOL (ISOVUE-300) INJECTION 61%  COMPARISON: None.  FINDINGS: Lower chest: Upper normal heart size.  Hepatobiliary: Mildly contracted gallbladder  Pancreas: Unremarkable  Spleen: Unremarkable  Adrenals/Urinary Tract: Unremarkable  Stomach/Bowel: Wall thickening in the distal sigmoid colon and rectum with smooth contour of the thickened rectal wall. Otherwise unremarkable.  Vascular/Lymphatic: Unremarkable  Reproductive: Calcified vas deferens. Otherwise unremarkable.  Other: Small areas of subcutaneous edema along the anterior abdominal wall probably from injections.  Musculoskeletal: Lucent expansile lesion of the right ninth rib with considerable endosteal scalloping, measuring 7.6 by 1.2 by 2.0 cm, and slight lobulation of the thinned cortex as shown on image 18/6, no obvious rib fracture. No other lower rib abnormality is identified. Bridging spurring of the left sacroiliac joint superiorly. No other lytic expansile lesion is identified in the abdomen or pelvis.  Mild prominence of epidural adipose tissues without overt impingement.  IMPRESSION: 1. Expansile lucent right ninth rib lesion measuring 7.6 by 1.2 by 2.0 cm, without discrete fracture. Extremely subtle on radiography, but in retrospect I suspect unchanged from 11/17/2013, making an aggressive etiology such as metastatic disease or myeloma unlikely. Accordingly, top differential diagnostic considerations include fibrous dysplasia, enchondroma, aneurysmal  bone cyst, or eosinophilic granuloma. This lesion is very subtle on radiography, but in retrospect I suspect that is not changed chest radiograph of from 11/17/2013, which is reassuring that this is likely a benign process (making metastatic disease or myeloma considerably less likely). 2. Wall thickening in the distal sigmoid colon and rectum, suggestive of a distal colitis.   Electronically Signed  By: Van Clines M.D.  On: 01/18/2016 16:53 ____________________________________________   PROCEDURES    Procedures    ____________________________________________   INITIAL IMPRESSION / ASSESSMENT AND PLAN / ED COURSE  Pertinent labs & imaging results that were available during my care of the patient were reviewed by me and considered in my medical decision making (see chart for details). Discussed with Dr. Roland Rack orthopedics. He will follow-up the rib. Patient has a normal white count no abdominal pain below the right upper quadrant. I will not treat the thickening of the distal sigmoid. I will refer him to gastroenterology.  ____________________________________________   FINAL CLINICAL IMPRESSION(S) / ED DIAGNOSES  Final diagnoses:  Rib pain on right side  Colitis      NEW MEDICATIONS STARTED DURING THIS VISIT:  Discharge Medication List as of 01/18/2016  6:24 PM       Note:  This document was prepared using Dragon voice recognition software and may include unintentional dictation errors.    Nena Polio, MD 01/18/16 2240

## 2016-01-18 NOTE — ED Notes (Signed)
Pt in CT scan.

## 2016-01-18 NOTE — ED Notes (Signed)
MD at bedside. 

## 2016-01-18 NOTE — ED Notes (Signed)
Pt felt like blood sugar low. Checked and is low. [place] tray and juice X 2 given to pt. Dr Cinda Quest aware

## 2016-01-18 NOTE — ED Notes (Signed)
Patient transported to X-ray 

## 2016-01-18 NOTE — Discharge Instructions (Signed)
Chest Wall Pain Chest wall pain is pain in or around the bones and muscles of your chest. Sometimes, an injury causes this pain. Sometimes, the cause may not be known. This pain may take several weeks or longer to get better. HOME CARE Pay attention to any changes in your symptoms. Take these actions to help with your pain:  Rest as told by your doctor.  Avoid activities that cause pain. Try not to use your chest, belly (abdominal), or side muscles to lift heavy things.  If directed, apply ice to the painful area:  Put ice in a plastic bag.  Place a towel between your skin and the bag.  Leave the ice on for 20 minutes, 2-3 times per day.  Take over-the-counter and prescription medicines only as told by your doctor.  Do not use tobacco products, including cigarettes, chewing tobacco, and e-cigarettes. If you need help quitting, ask your doctor.  Keep all follow-up visits as told by your doctor. This is important. GET HELP IF:  You have a fever.  Your chest pain gets worse.  You have new symptoms. GET HELP RIGHT AWAY IF:  You feel sick to your stomach (nauseous) or you throw up (vomit).  You feel sweaty or light-headed.  You have a cough with phlegm (sputum) or you cough up blood.  You are short of breath.   This information is not intended to replace advice given to you by your health care provider. Make sure you discuss any questions you have with your health care provider.   Document Released: 12/11/2007 Document Revised: 03/15/2015 Document Reviewed: 09/19/2014 Elsevier Interactive Patient Education 2016 [employer]  Colitis Colitis is inflammation of the colon. Colitis may last a short time (acute) or it may last a long time (chronic). CAUSES This condition may be caused by:  Viruses.  Bacteria.  Reactions to medicine.  Certain autoimmune diseases, such as Crohn disease or ulcerative colitis. SYMPTOMS Symptoms of this condition  include:  Diarrhea.  Passing bloody or tarry stool.  Pain.  Fever.  Vomiting.  Tiredness (fatigue).  Weight loss.  Bloating.  Sudden increase in abdominal pain.  Having fewer bowel movements than usual. DIAGNOSIS This condition is diagnosed with a stool test or a blood test. You may also have other tests, including X-rays, a CT scan, or a colonoscopy. TREATMENT Treatment may include:  Resting the bowel. This involves not eating or drinking for a period of time.  Fluids that are given through an IV tube.  Medicine for pain and diarrhea.  Antibiotic medicines.  Cortisone medicines.  Surgery. HOME CARE INSTRUCTIONS Eating and Drinking  Follow instructions from your health care provider about eating or drinking restrictions.  Drink enough fluid to keep your urine clear or pale yellow.  Work with a dietitian to determine which foods cause your condition to flare up.  Avoid foods that cause flare-ups.  Eat a well-balanced diet. Medicines  Take over-the-counter and prescription medicines only as told by your health care provider.  If you were prescribed an antibiotic medicine, take it as told by your health care provider. Do not stop taking the antibiotic even if you start to feel better. General Instructions  Keep all follow-up visits as told by your health care provider. This is important. SEEK MEDICAL CARE IF:  Your symptoms do not go away.  You develop new symptoms. SEEK IMMEDIATE MEDICAL CARE IF:  You have a fever that does not go away with treatment.  You develop chills.  You  have extreme weakness, fainting, or dehydration.  You have repeated vomiting.  You develop severe pain in your abdomen.  You pass bloody or tarry stool.   This information is not intended to replace advice given to you by your health care provider. Make sure you discuss any questions you have with your health care provider.   Document Released: 08/01/2004 Document  Revised: 03/15/2015 Document Reviewed: 10/17/2014 Elsevier Interactive Patient Education 2016 [place].   Please call Dr. Roland Rack the orthopedic doctor. I discussed your case with him and he will follow up that spot on your rib. Please call Dr. Vira Agar as well. Dr. Vira Agar is a gastroenterologist. We do not have a gastroenterologist on call tonight at all but I think that Dr. Vira Agar will be able to see you in the office if you call him. 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 Please return here if you develop lower abdominal pain fever nausea vomiting diarrhea or feel sicker. If Dr. Vira Agar can't see you and your regular doctor cannot refer you to another gastroenterologist please return here. We should be able to find another gastroenterologist to CDU.

## 2016-01-22 DIAGNOSIS — K529 Noninfective gastroenteritis and colitis, unspecified: Secondary | ICD-10-CM | POA: Insufficient documentation

## 2016-01-29 ENCOUNTER — Other Ambulatory Visit: Payer: Self-pay | Admitting: Surgery

## 2016-01-29 DIAGNOSIS — D167 Benign neoplasm of ribs, sternum and clavicle: Secondary | ICD-10-CM

## 2016-02-10 ENCOUNTER — Ambulatory Visit: Payer: BLUE CROSS/BLUE SHIELD

## 2016-02-13 DIAGNOSIS — E109 Type 1 diabetes mellitus without complications: Secondary | ICD-10-CM | POA: Insufficient documentation

## 2016-02-16 ENCOUNTER — Emergency Department
Admission: EM | Admit: 2016-02-16 | Discharge: 2016-02-16 | Disposition: A | Discharge status: Home / Self Care | Payer: BLUE CROSS/BLUE SHIELD | Attending: Emergency Medicine | Admitting: Emergency Medicine

## 2016-02-16 ENCOUNTER — Emergency Department: Payer: BLUE CROSS/BLUE SHIELD

## 2016-02-16 ENCOUNTER — Encounter: Payer: Self-pay | Admitting: Emergency Medicine

## 2016-02-16 DIAGNOSIS — Z7982 Long term (current) use of aspirin: Secondary | ICD-10-CM | POA: Diagnosis not present

## 2016-02-16 DIAGNOSIS — I10 Essential (primary) hypertension: Secondary | ICD-10-CM | POA: Insufficient documentation

## 2016-02-16 DIAGNOSIS — Z794 Long term (current) use of insulin: Secondary | ICD-10-CM | POA: Diagnosis not present

## 2016-02-16 DIAGNOSIS — M79605 Pain in left leg: Secondary | ICD-10-CM | POA: Insufficient documentation

## 2016-02-16 DIAGNOSIS — M25552 Pain in left hip: Secondary | ICD-10-CM | POA: Insufficient documentation

## 2016-02-16 DIAGNOSIS — E119 Type 2 diabetes mellitus without complications: Secondary | ICD-10-CM | POA: Insufficient documentation

## 2016-02-16 LAB — CBC WITH DIFFERENTIAL/PLATELET
Basophils Absolute: 0 10*3/uL (ref 0–0.1)
Basophils Relative: 1 %
EOS ABS: 0.1 10*3/uL (ref 0–0.7)
EOS PCT: 2 %
HCT: 40.7 % (ref 40.0–52.0)
Hemoglobin: 13.8 g/dL (ref 13.0–18.0)
LYMPHS ABS: 1.4 10*3/uL (ref 1.0–3.6)
Lymphocytes Relative: 45 %
MCH: 28.8 pg (ref 26.0–34.0)
MCHC: 33.9 g/dL (ref 32.0–36.0)
MCV: 84.9 fL (ref 80.0–100.0)
MONOS PCT: 10 %
Monocytes Absolute: 0.3 10*3/uL (ref 0.2–1.0)
Neutro Abs: 1.3 10*3/uL — ABNORMAL LOW (ref 1.4–6.5)
Neutrophils Relative %: 42 %
PLATELETS: 139 10*3/uL — AB (ref 150–440)
RBC: 4.8 MIL/uL (ref 4.40–5.90)
RDW: 13.9 % (ref 11.5–14.5)
WBC: 3.2 10*3/uL — ABNORMAL LOW (ref 3.8–10.6)

## 2016-02-16 LAB — URINALYSIS COMPLETE WITH MICROSCOPIC (ARMC ONLY)
BILIRUBIN URINE: NEGATIVE
Bacteria, UA: NONE SEEN
KETONES UR: NEGATIVE mg/dL
Leukocytes, UA: NEGATIVE
NITRITE: NEGATIVE
Protein, ur: 30 mg/dL — AB
RBC / HPF: NONE SEEN RBC/hpf (ref 0–5)
SPECIFIC GRAVITY, URINE: 1.018 (ref 1.005–1.030)
Squamous Epithelial / LPF: NONE SEEN
pH: 5 (ref 5.0–8.0)

## 2016-02-16 LAB — BASIC METABOLIC PANEL
Anion gap: 5 (ref 5–15)
BUN: 24 mg/dL — ABNORMAL HIGH (ref 6–20)
CALCIUM: 9.2 mg/dL (ref 8.9–10.3)
CHLORIDE: 101 mmol/L (ref 101–111)
CO2: 27 mmol/L (ref 22–32)
CREATININE: 1.11 mg/dL (ref 0.61–1.24)
GFR calc non Af Amer: >60 mL/min (ref >60)
Glucose, Bld: 312 mg/dL — ABNORMAL HIGH (ref 65–99)
Potassium: 4.8 mmol/L (ref 3.5–5.1)
SODIUM: 133 mmol/L — AB (ref 135–145)

## 2016-02-16 MED ORDER — DIAZEPAM 5 MG PO TABS
5.0000 mg | ORAL_TABLET | Freq: Once | ORAL | Status: DC
Start: 2016-02-16 — End: 2016-02-16
  Filled 2016-02-16: qty 1

## 2016-02-16 MED ORDER — BACLOFEN 10 MG PO TABS: 10.0000 mg | ORAL_TABLET | Freq: Three times a day (TID) | ORAL | 0 refills | Status: DC

## 2016-02-16 MED ORDER — MELOXICAM 15 MG PO TABS: 15.0000 mg | ORAL_TABLET | Freq: Every day | ORAL | 0 refills | Status: DC

## 2016-02-16 MED ORDER — OXYCODONE-ACETAMINOPHEN 5-325 MG PO TABS
1.0000 | ORAL_TABLET | Freq: Once | ORAL | Status: DC
  Filled 2016-02-16: qty 1

## 2016-02-16 MED ORDER — IBUPROFEN 800 MG PO TABS
800.0000 mg | ORAL_TABLET | Freq: Once | ORAL | Status: AC
  Administered 2016-02-16: 800 mg via ORAL

## 2016-02-16 MED ORDER — IBUPROFEN 800 MG PO TABS
ORAL_TABLET | ORAL | Status: AC
  Filled 2016-02-16: qty 1

## 2016-02-16 NOTE — ED Notes (Signed)
Patient transported to Ultrasound 

## 2016-02-16 NOTE — ED Notes (Addendum)
Pt. Reports Left leg pain from left hip to left knee. More pain upon moving. Started one month ago but has gotten worse

## 2016-02-16 NOTE — ED Provider Notes (Signed)
[facility] Emergency Department Provider Note  ____________________________________________  Time seen: Approximately 12:12 PM  I have reviewed the triage vital signs and the nursing notes.   HISTORY  Chief Complaint Leg Pain    HPI Nathan Chan is a 45 y.o. male presents for evaluation of left leg pain. He reports that his left leg is progressively worsening over the past month reports he is a diabetic. Patient reports that his sugars are poorly controlled. Denies any trauma but the pain does radiate down his legs worse when standing or walking or moving. Has been taking Tylenol over-the-counter for rest only   Past Medical History:  Diagnosis Date  . Diabetes mellitus without complication ([place])   . Hypertension     Patient Active Problem List   Diagnosis Date Noted  . Porokeratosis 05/21/2015    Past Surgical History:  Procedure Laterality Date  . EYE SURGERY      Prior to Admission medications   Medication Sig Start Date End Date Taking? Authorizing Provider  albuterol (PROAIR HFA) 108 (90 BASE) MCG/ACT inhaler Inhale into the lungs. 06/15/14 06/15/15  Historical Provider, MD  aspirin 81 MG tablet Take 81 mg by mouth daily.    Historical Provider, MD  atorvastatin (LIPITOR) 20 MG tablet Take 20 mg by mouth daily at 6 PM.  01/05/15 01/05/16  Historical Provider, MD  baclofen (LIORESAL) 10 MG tablet Take 1 tablet (10 mg total) by mouth 3 (three) times daily. 02/16/16   Pierce Crane Junius Faucett, PA-C  cyclobenzaprine (FLEXERIL) 5 MG tablet Take 1 tablet (5 mg total) by mouth 3 (three) times daily as needed for muscle spasms. 05/14/15   Jenise V Bacon Menshew, PA-C  insulin aspart (NOVOLOG FLEXPEN) 100 UNIT/ML FlexPen Take 8 units with breakfast and dinner, 4 units at lunch. 11/26/13   Historical Provider, MD  insulin aspart protamine - aspart (NOVOLOG 70/30 MIX) (70-30) 100 UNIT/ML FlexPen 35 units in the am and 15 units in the pm subcutaneous 01/05/15   Historical  Provider, MD  lisinopril (PRINIVIL,ZESTRIL) 10 MG tablet Take 10 mg by mouth daily.  01/05/15 01/05/16  Historical Provider, MD  meloxicam (MOBIC) 15 MG tablet Take 1 tablet (15 mg total) by mouth daily. 02/16/16   Arlyss Repress, PA-C    Allergies Review of patient's allergies indicates no known allergies.  No family history on file.  Social History Social History  Substance Use Topics  . Smoking status: Never Smoker  . Smokeless tobacco: Never Used  . Alcohol use No    Review of Systems Constitutional: No fever/chills Cardiovascular: Denies chest pain. Respiratory: Denies shortness of breath. Gastrointestinal: No abdominal pain.  No nausea, no vomiting.  No diarrhea.  No constipation. Genitourinary: Negative for dysuria. Musculoskeletal: Positive for left hip and upper leg pain. Skin: Negative for rash. Neurological: Negative for headaches, focal weakness or numbness.  10-point ROS otherwise negative.  ____________________________________________   PHYSICAL EXAM:  VITAL SIGNS: ED Triage Vitals  Enc Vitals Group     BP 02/16/16 1159 135/89     Pulse Rate 02/16/16 1159 97     Resp 02/16/16 1159 18     Temp 02/16/16 1159 98.5 F (36.9 C)     Temp Source 02/16/16 1159 Oral     SpO2 02/16/16 1159 99 %     Weight 02/16/16 1159 195 lb (88.5 kg)     Height 02/16/16 1159 6' (1.829 m)     Head Circumference --      Peak Flow --  Pain Score 02/16/16 1200 7     Pain Loc --      Pain Edu? --      Excl. in [place]? --     Constitutional: Alert and oriented. Well appearing and in no acute distress. Neck: No stridor. Supple, full range of motion nontender  Cardiovascular: Normal rate, regular rhythm. Grossly normal heart sounds.  Good peripheral circulation. Respiratory: Normal respiratory effort.  No retractions. Lungs CTAB. Gastrointestinal: Soft and nontender. No distention. No abdominal bruits. No CVA tenderness. Musculoskeletal: Left hip with point tenderness and  pelvic rock. Distally neurovascularly intact. Negative Homans. Negative straight leg raise. Neurologic:  Normal speech and language. No gross focal neurologic deficits are appreciated. No gait instability. Skin:  Skin is warm, dry and intact. No rash noted. Psychiatric: Mood and affect are normal. Speech and behavior are normal.  ____________________________________________   LABS (all labs ordered are listed, but only abnormal results are displayed)  Labs Reviewed  BASIC METABOLIC PANEL - Abnormal; Notable for the following:       Result Value   Sodium 133 (*)    Glucose, Bld 312 (*)    BUN 24 (*)    All other components within normal limits  URINALYSIS COMPLETEWITH MICROSCOPIC ([facility] ONLY) - Abnormal; Notable for the following:    Color, Urine STRAW (*)    APPearance CLEAR (*)    Glucose, UA >500 (*)    Hgb urine dipstick 1+ (*)    Protein, ur 30 (*)    All other components within normal limits  CBC WITH DIFFERENTIAL/PLATELET - Abnormal; Notable for the following:    WBC 3.2 (*)    Platelets 139 (*)    Neutro Abs 1.3 (*)    All other components within normal limits  CBC WITH DIFFERENTIAL/PLATELET   ____________________________________________  EKG   ____________________________________________  RADIOLOGY  Ultrasound negative left lower extremity. ____________________________________________   PROCEDURES  Procedure(s) performed: None  Critical Care performed: No  ____________________________________________   INITIAL IMPRESSION / ASSESSMENT AND PLAN / ED COURSE  Pertinent labs & imaging results that were available during my care of the patient were reviewed by me and considered in my medical decision making (see chart for details).  Poorly controlled diabetic with nonspecific left hip and leg pain. Patient is discharged with prescriptions for baclofen and meloxicam. Instructed to follow-up with his PCP or return to ER with any worsening  symptomology.  Clinical Course    ____________________________________________   FINAL CLINICAL IMPRESSION(S) / ED DIAGNOSES  Final diagnoses:  Left leg pain     This chart was dictated using voice recognition software/Dragon. Despite best efforts to proofread, errors can occur which can change the meaning. Any change was purely unintentional.     Arlyss Repress, PA-C 02/16/16 1745    Lavonia Drafts, MD 02/18/16 [phone]

## 2016-02-16 NOTE — ED Triage Notes (Signed)
Patient presents to the ED with left leg pain x 1 month.  Patient states pain has been increasing over time.  Patient denies any known injury.  Patient is in no obvious distress at this time.

## 2016-02-19 ENCOUNTER — Other Ambulatory Visit: Payer: Self-pay

## 2016-02-20 ENCOUNTER — Encounter: Payer: Self-pay | Admitting: Cardiothoracic Surgery

## 2016-02-20 ENCOUNTER — Ambulatory Visit (INDEPENDENT_AMBULATORY_CARE_PROVIDER_SITE_OTHER): Payer: BLUE CROSS/BLUE SHIELD | Admitting: Cardiothoracic Surgery

## 2016-02-20 VITALS — BP 151/96 | HR 90 | Temp 98.6°F | Resp 18 | Ht 72.0 in | Wt 198.2 lb

## 2016-02-20 DIAGNOSIS — M954 Acquired deformity of chest and rib: Secondary | ICD-10-CM | POA: Diagnosis not present

## 2016-02-20 NOTE — Progress Notes (Signed)
Patient ID: Nathan Chan, male   DOB: 1970/11/15, 45 y.o.   MRN: [id number]  Chief Complaint  Patient presents with  . New Patient (Initial Visit)    Benign Neoplasm of Right Rib    Referred By Dr. Roland Rack Reason for Referral Rib mass  HPI Location, Quality, Duration, Severity, Timing, Context, Modifying Factors, Associated Signs and Symptoms.  Nathan Chan is a 45 y.o. male.  Approximately 6 weeks ago the patient began experiencing the acute onset of severe stabbing right sided chest wall pain. He describes the pain as stabbing in nature without radiation. There are no alleviating factors or exacerbating factors. Coughing or sneezing does not increase the pain. He has tried local heat and cold therapy as well as a variety of over-the-counter medications and prescription medications including narcotics and muscle relaxants. He states that none of these have really worked. In addition he is complaining of left hip and thigh pain which is similar in quality to the rib pain. He went to the emergency department where a chest x-ray was performed as well as a CT scan. The only abnormality identified was a ninth rib lesion which was present on a chest x-ray back in 2015. The lesion has a benign appearance making a diagnosis of malignancy unlikely. He also underwent an ultrasound of his legs which were negative for DVT. Of note is that the patient has uncontrolled diabetes with blood sugars in the 300 range. He states this is secondary to poor dietary compliance. He does not smoke. He works Statistician.   Past Medical History:  Diagnosis Date  . Diabetes mellitus without complication ([place])   . Hyperlipidemia   . Hypertension   . Meningitis 2015    Past Surgical History:  Procedure Laterality Date  . EYE SURGERY Right 09/2015    Family History  Problem Relation Age of Onset  . Stroke Mother   . Diabetes Mother   . Hypertension Mother   . Stroke Father   . Diabetes Father   . Hypertension  Father   . Stroke Sister   . Hypertension Maternal Grandmother   . Hypertension Paternal Grandmother     Social History Social History  Substance Use Topics  . Smoking status: Never Smoker  . Smokeless tobacco: Never Used  . Alcohol use 0.0 oz/week     Comment: Rarely    No Known Allergies  Current Outpatient Prescriptions  Medication Sig Dispense Refill  . aspirin 81 MG tablet Take 81 mg by mouth daily.    . baclofen (LIORESAL) 10 MG tablet Take 1 tablet (10 mg total) by mouth 3 (three) times daily. 30 tablet 0  . HYDROcodone-acetaminophen (NORCO/VICODIN) 5-325 MG tablet Take 1 tablet by mouth 4 (four) times daily as needed.  0  . insulin aspart protamine - aspart (NOVOLOG 70/30 MIX) (70-30) 100 UNIT/ML FlexPen 25units BID    . insulin glargine (LANTUS) 100 UNIT/ML injection Inject 20 Units into the skin at bedtime.    Marland Kitchen lisinopril (PRINIVIL,ZESTRIL) 20 MG tablet Take 20 mg by mouth daily.    . meloxicam (MOBIC) 15 MG tablet Take 1 tablet (15 mg total) by mouth daily. 30 tablet 0  . traMADol (ULTRAM) 50 MG tablet Take 1 tablet by mouth 1 day or 1 dose.     No current facility-administered medications for this visit.       Review of Systems A complete review of systems was asked and was negative except for the following positive findings joint pain  Blood  pressure (!) 151/96, pulse 90, temperature 98.6 F (37 C), temperature source Oral, resp. rate 18, height 6' (1.829 m), weight 198 lb 3.2 oz (89.9 kg), SpO2 98 %.  Physical Exam CONSTITUTIONAL:  Pleasant, well-developed, well-nourished, and in no acute distress. EYES: Pupils equal and reactive to light, Sclera non-icteric EARS, NOSE, MOUTH AND THROAT:  The oropharynx was clear.  Dentition is good repair.  Oral mucosa pink and moist. LYMPH NODES:  Lymph nodes in the neck and axillae were normal RESPIRATORY:  Lungs were clear.  Normal respiratory effort without pathologic use of accessory muscles of  respiration CARDIOVASCULAR: Heart was regular without murmurs.  There were no carotid bruits. GI: The abdomen was soft, nontender, and nondistended. There were no palpable masses. There was no hepatosplenomegaly. There were normal bowel sounds in all quadrants. GU:  Rectal deferred.   MUSCULOSKELETAL:  Normal muscle strength and tone.  No clubbing or cyanosis.   SKIN:  There were no pathologic skin lesions.  There were no nodules on palpation. NEUROLOGIC:  Sensation is normal.  Cranial nerves are grossly intact. PSYCH:  Oriented to person, place and time.  Mood and affect are normal.  Data Reviewed CT scans and chest x-rays  I have personally reviewed the patient's imaging, laboratory findings and medical records.    Assessment    I have independently reviewed the patient's CT scan and chest x-rays. 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 This has been present and unchanged since 2015. There is no associated soft tissue mass.    Plan    The patient states that he may have had some x-rays made at [facility]. He will go and get those force for our review. In addition I will set him up to see one of our hematologists since his white blood cell count has been low and his platelet count and absolute neutrophil count are on the low side of normal. I will see him back again in one month.       Nestor Lewandowsky, MD 02/20/2016, 9:07 AM

## 2016-02-20 NOTE — Patient Instructions (Signed)
Please obtain X-rays from other hospitals. Once these discs have been obtained, please call our office at the above number and Dr. Genevive Bi will review them to ensure that this area has not grown within the last 10 years.  Please call our office with any questions or concerns that you may have.

## 2016-02-21 ENCOUNTER — Telehealth: Payer: Self-pay

## 2016-02-21 ENCOUNTER — Telehealth: Payer: Self-pay | Admitting: Cardiothoracic Surgery

## 2016-02-21 NOTE — Telephone Encounter (Signed)
Colletta [place], patient's caregiver called asking for the appointment that was scheduled for patient. I gave her the information. Stephanie understood. Patient has an appointment with Dr. Rogue Bussing on 02/23/2016 at 11:00 AM but to arrive 15 minutes earlier.

## 2016-02-21 NOTE — Telephone Encounter (Signed)
[place] called with appointment information. Dr Rogue Bussing - Friday August 18th at [facility] has been unable to reach patient, I will attempt to call him

## 2016-02-23 ENCOUNTER — Ambulatory Visit
Admission: RE | Admit: 2016-02-23 | Discharge: 2016-02-23 | Disposition: A | Discharge status: Home / Self Care | Payer: BLUE CROSS/BLUE SHIELD | Source: Ambulatory Visit | Attending: Internal Medicine | Admitting: Internal Medicine

## 2016-02-23 ENCOUNTER — Encounter (INDEPENDENT_AMBULATORY_CARE_PROVIDER_SITE_OTHER): Payer: Self-pay

## 2016-02-23 ENCOUNTER — Inpatient Hospital Stay: Payer: BLUE CROSS/BLUE SHIELD

## 2016-02-23 ENCOUNTER — Inpatient Hospital Stay: Payer: BLUE CROSS/BLUE SHIELD | Attending: Internal Medicine | Admitting: Internal Medicine

## 2016-02-23 DIAGNOSIS — M25552 Pain in left hip: Secondary | ICD-10-CM | POA: Diagnosis not present

## 2016-02-23 DIAGNOSIS — D759 Disease of blood and blood-forming organs, unspecified: Secondary | ICD-10-CM | POA: Diagnosis not present

## 2016-02-23 DIAGNOSIS — Z7982 Long term (current) use of aspirin: Secondary | ICD-10-CM | POA: Diagnosis not present

## 2016-02-23 DIAGNOSIS — M899 Disorder of bone, unspecified: Secondary | ICD-10-CM | POA: Insufficient documentation

## 2016-02-23 DIAGNOSIS — E785 Hyperlipidemia, unspecified: Secondary | ICD-10-CM | POA: Insufficient documentation

## 2016-02-23 DIAGNOSIS — Z79899 Other long term (current) drug therapy: Secondary | ICD-10-CM | POA: Diagnosis not present

## 2016-02-23 DIAGNOSIS — C9030 Solitary plasmacytoma not having achieved remission: Secondary | ICD-10-CM

## 2016-02-23 DIAGNOSIS — Z8661 Personal history of infections of the central nervous system: Secondary | ICD-10-CM | POA: Diagnosis not present

## 2016-02-23 DIAGNOSIS — E1165 Type 2 diabetes mellitus with hyperglycemia: Secondary | ICD-10-CM | POA: Insufficient documentation

## 2016-02-23 DIAGNOSIS — R0789 Other chest pain: Secondary | ICD-10-CM | POA: Diagnosis not present

## 2016-02-23 DIAGNOSIS — Z794 Long term (current) use of insulin: Secondary | ICD-10-CM | POA: Diagnosis not present

## 2016-02-23 DIAGNOSIS — I10 Essential (primary) hypertension: Secondary | ICD-10-CM | POA: Insufficient documentation

## 2016-02-23 LAB — CBC WITH DIFFERENTIAL/PLATELET
BASOS PCT: 2 %
Basophils Absolute: 0.1 10*3/uL (ref 0–0.1)
Eosinophils Absolute: 0.1 10*3/uL (ref 0–0.7)
Eosinophils Relative: 2 %
HEMATOCRIT: 40.1 % (ref 40.0–52.0)
HEMOGLOBIN: 13.3 g/dL (ref 13.0–18.0)
Lymphocytes Relative: 21 %
Lymphs Abs: 0.8 10*3/uL — ABNORMAL LOW (ref 1.0–3.6)
MCH: 28.2 pg (ref 26.0–34.0)
MCHC: 33.1 g/dL (ref 32.0–36.0)
MCV: 85.3 fL (ref 80.0–100.0)
MONOS PCT: 11 %
Monocytes Absolute: 0.4 10*3/uL (ref 0.2–1.0)
NEUTROS ABS: 2.5 10*3/uL (ref 1.4–6.5)
NEUTROS PCT: 64 %
Platelets: 147 10*3/uL — ABNORMAL LOW (ref 150–440)
RBC: 4.71 MIL/uL (ref 4.40–5.90)
RDW: 13.8 % (ref 11.5–14.5)
WBC: 3.9 10*3/uL (ref 3.8–10.6)

## 2016-02-23 LAB — COMPREHENSIVE METABOLIC PANEL
ALK PHOS: 73 U/L (ref 38–126)
ALT: 26 U/L (ref 17–63)
ANION GAP: 6 (ref 5–15)
AST: 22 U/L (ref 15–41)
Albumin: 4.3 g/dL (ref 3.5–5.0)
BUN: 22 mg/dL — ABNORMAL HIGH (ref 6–20)
CALCIUM: 8.7 mg/dL — AB (ref 8.9–10.3)
CHLORIDE: 102 mmol/L (ref 101–111)
CO2: 29 mmol/L (ref 22–32)
Creatinine, Ser: 1.19 mg/dL (ref 0.61–1.24)
GFR calc non Af Amer: >60 mL/min (ref >60)
Glucose, Bld: 206 mg/dL — ABNORMAL HIGH (ref 65–99)
Potassium: 3.8 mmol/L (ref 3.5–5.1)
SODIUM: 137 mmol/L (ref 135–145)
Total Bilirubin: 0.5 mg/dL (ref 0.3–1.2)
Total Protein: 7.6 g/dL (ref 6.5–8.1)

## 2016-02-23 LAB — LACTATE DEHYDROGENASE: LDH: 193 U/L — ABNORMAL HIGH (ref 98–192)

## 2016-02-23 NOTE — Progress Notes (Signed)
[facility] CONSULT NOTE  Patient Care Team: [facility] as PCP - General (Family Medicine)  CHIEF COMPLAINTS/PURPOSE OF CONSULTATION:   #  Oncology History   # AUG 2017 RIGHT 9th RIB ~ 7cm lesion ? 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  male with a history of insulin-dependent diabetes; noted to have pain in his right chest wall in the last 2 months or so. He rates it 7 on a scale of 10. He states that coughing and movement makes it worse. He also complains of intermittent pain in his left hip in the last 2 months.  He denies any nausea vomiting. Denies any weight loss. Denies any chest pain or shortness of breath or cough. No tingling and numbness. Denies any alcohol abuse.   ROS: A complete 10 point review of system is done which is negative except mentioned above in history of present illness  MEDICAL HISTORY:  Past Medical History:  Diagnosis Date  . Diabetes mellitus without complication ([place])   . Hyperlipidemia   . Hypertension   . Meningitis 2015    SURGICAL HISTORY: Past Surgical History:  Procedure Laterality Date  . 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  Social History   Social History  . Marital status: Legally Separated    Spouse name: N/A  . Number of children: N/A  . Years of education: N/A   Occupational History  . Not on file.   Social History Main Topics  . Smoking status: Never Smoker  . Smokeless tobacco: Never Used  . Alcohol use 0.0 oz/week     Comment: Rarely  . Drug use: No  . Sexual activity: Not on file   Other Topics Concern  . Not on file   Social History Narrative  . No narrative on file    FAMILY HISTORY: Family History  Problem Relation Age of Onset  . Stroke Mother   . Diabetes Mother   . Hypertension  Mother   . Stroke Father   . Diabetes Father   . Hypertension Father   . Stroke Sister   . Hypertension Maternal Grandmother   . Hypertension Paternal Grandmother     ALLERGIES:  has No Known Allergies.  MEDICATIONS:  Current Outpatient Prescriptions  Medication Sig Dispense Refill  . aspirin 81 MG tablet Take 81 mg by mouth daily.    . baclofen (LIORESAL) 10 MG tablet Take 1 tablet (10 mg total) by mouth 3 (three) times daily. 30 tablet 0  . HYDROcodone-acetaminophen (NORCO/VICODIN) 5-325 MG tablet Take 1 tablet by mouth 4 (four) times daily as needed.  0  . insulin aspart protamine - aspart (NOVOLOG 70/30 MIX) (70-30) 100 UNIT/ML FlexPen 25units BID    . insulin glargine (LANTUS) 100 UNIT/ML injection Inject 20 Units into the skin at bedtime.    Marland Kitchen lisinopril (PRINIVIL,ZESTRIL) 20 MG tablet Take 20 mg by mouth daily.    . meloxicam (MOBIC) 15 MG tablet Take 1 tablet (15 mg total) by mouth daily. 30 tablet 0  . traMADol (ULTRAM) 50 MG tablet Take 1 tablet by mouth 1 day or 1 dose.     No current facility-administered medications for this visit.       Marland Kitchen  PHYSICAL EXAMINATION: ECOG PERFORMANCE STATUS:  1 - Symptomatic but completely ambulatory  Vitals:   02/23/16 1148  BP: (!) 142/89  Pulse: 88  Resp: 18  Temp: 97.5 F (36.4 C)   Filed Weights   02/23/16 1148  Weight: 201 lb 1 oz (91.2 kg)    GENERAL: Well-nourished well-developed; Alert, no distress and comfortable.   Alone.  EYES: no pallor or icterus OROPHARYNX: no thrush or ulceration; good dentition  NECK: supple, no masses felt LYMPH:  no palpable lymphadenopathy in the cervical, axillary or inguinal regions LUNGS: clear to auscultation and  No wheeze or crackles HEART/CVS: regular rate & rhythm and no murmurs; No lower extremity edema ABDOMEN: abdomen soft, non-tender and normal bowel sounds Musculoskeletal:no cyanosis of digits and no clubbing; tenderness on palpation right chest wall- around 9 rib.  PSYCH:  alert & oriented x 3 with fluent speech NEURO: no focal motor/sensory deficits SKIN:  no rashes or significant lesions;   LABORATORY DATA:  I have reviewed the data as listed Lab Results  Component Value Date   WBC 3.9 02/23/2016   HGB 13.3 02/23/2016   HCT 40.1 02/23/2016   MCV 85.3 02/23/2016   PLT 147 (L) 02/23/2016    Recent Labs  01/18/16 1417 02/16/16 1246 02/23/16 1214  NA 139 133* 137  K 4.1 4.8 3.8  CL 103 101 102  CO2 28 27 29   GLUCOSE 107* 312* 206*  BUN 27* 24* 22*  CREATININE 1.17 1.11 1.19  CALCIUM 9.8 9.2 8.7*  GFRNONAA >60 >60 >60  GFRAA >60 >60 >60  PROT 7.7  --  7.6  ALBUMIN 4.5  --  4.3  AST 20  --  22  ALT 23  --  26  ALKPHOS 61  --  73  BILITOT 0.4  --  0.5    RADIOGRAPHIC STUDIES: I have personally reviewed the radiological images as listed and agreed with the findings in the report. US Venous Img Lower Unilateral Left  Result Date: 02/16/2016 CLINICAL DATA:  Left lower extremity pain radiating from the hip to the knee for the past month. Evaluate for DVT. EXAM: LEFT LOWER EXTREMITY VENOUS DOPPLER ULTRASOUND TECHNIQUE: Gray-scale sonography with graded compression, as well as color Doppler and duplex ultrasound were performed to evaluate the lower extremity deep venous systems from the level of the common femoral vein and including the common femoral, femoral, profunda femoral, popliteal and calf veins including the posterior tibial, peroneal and gastrocnemius veins when visible. The superficial great saphenous vein was also interrogated. Spectral Doppler was utilized to evaluate flow at rest and with distal augmentation maneuvers in the common femoral, femoral and popliteal veins. COMPARISON:  None. FINDINGS: Contralateral Common Femoral Vein: Respiratory phasicity is normal and symmetric with the symptomatic side. No evidence of thrombus. Normal compressibility. Common Femoral Vein: No evidence of thrombus. Normal compressibility, respiratory  phasicity and response to augmentation. Saphenofemoral Junction: No evidence of thrombus. Normal compressibility and flow on color Doppler imaging. Profunda Femoral Vein: No evidence of thrombus. Normal compressibility and flow on color Doppler imaging. Femoral Vein: No evidence of thrombus. Normal compressibility, respiratory phasicity and response to augmentation. Popliteal Vein: No evidence of thrombus. Normal compressibility, respiratory phasicity and response to augmentation. Calf Veins: No evidence of thrombus. Normal compressibility and flow on color Doppler imaging. Superficial Great Saphenous Vein: No evidence of thrombus. Normal compressibility and flow on color Doppler imaging. Venous Reflux:  None. Other Findings:  None. IMPRESSION: No evidence of DVT within the left lower extremity. Electronically Signed   By:  Sandi Mariscal M.D.   On: 02/16/2016 13:29   IMPRESSION: 1. Expansile lucent right ninth rib lesion measuring 7.6 by 1.2 by 2.0 cm, without discrete fracture. Extremely subtle on radiography, but in retrospect I suspect unchanged from 11/17/2013, making an aggressive etiology such as metastatic disease or myeloma unlikely. Accordingly, top differential diagnostic considerations include fibrous dysplasia, enchondroma, aneurysmal bone cyst, or eosinophilic granuloma. This lesion is very subtle on radiography, but in retrospect I suspect that is not changed chest radiograph of from 11/17/2013, which is reassuring that this is likely a benign process (making metastatic disease or myeloma considerably less likely). 2. Wall thickening in the distal sigmoid colon and rectum, suggestive of a distal colitis.    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 New-onset of pain. Question plasmacytoma. Recommend CBC CMP and LDH myeloma panel/kappa lambda light chain. Also check a PET scan. Check skeletal  survey.   # Mild, cytopenia platelets 139  # Pain control-continue pain ccontrol.   # Diabetes poorly controlled- on insulin.  # Above plan of care was discussed with Dr. Faith Rogue. Patient follow-up with me in approximately week or so to review the results of the above workup/PET scan.    All questions were answered. The patient knows to call the clinic with any problems, questions or concerns.       Cammie Sickle, MD 02/23/2016 1:48 PM

## 2016-02-23 NOTE — Assessment & Plan Note (Addendum)
Right ninth rib lesion noted on CT scan/also noted on previous imaging 2 years ago. New-onset of pain. Question plasmacytoma. Recommend CBC CMP and LDH myeloma panel/kappa lambda light chain. Also check a PET scan. Check skeletal survey.   # Mild, cytopenia platelets 139  # Pain control-continue pain ccontrol.   # Diabetes poorly controlled- on insulin.  # Above plan of care was discussed with Dr. Faith Rogue. Patient follow-up with me in approximately week or so to review the results of the above workup/PET scan.

## 2016-02-23 NOTE — Progress Notes (Signed)
Patient here today as new evaluation regarding right rib mass.  Referred by Dr. Genevive Bi.

## 2016-02-26 ENCOUNTER — Telehealth: Payer: Self-pay | Admitting: Internal Medicine

## 2016-02-26 LAB — KAPPA/LAMBDA LIGHT CHAINS
KAPPA, LAMDA LIGHT CHAIN RATIO: 1.08 (ref 0.26–1.65)
Kappa free light chain: 29.5 mg/L — ABNORMAL HIGH (ref 3.3–19.4)
LAMDA FREE LIGHT CHAINS: 27.4 mg/L — AB (ref 5.7–26.3)

## 2016-02-26 NOTE — Telephone Encounter (Signed)
Spoke to insurance declined PET scan.   Heather- please add pt case to tumor conference this week/imaging review. Dr.B

## 2016-02-26 NOTE — Telephone Encounter (Signed)
msg sent to scheduling to contact pt to let him know scan is cnl and to cnl scan in pet dept.  Also, msg sent to case conference team to add pt to conference list for this week.

## 2016-02-27 ENCOUNTER — Ambulatory Visit: Payer: BLUE CROSS/BLUE SHIELD

## 2016-02-27 LAB — MULTIPLE MYELOMA PANEL, SERUM
ALBUMIN/GLOB SERPL: 1.3 (ref 0.7–1.7)
ALPHA 1: 0.2 g/dL (ref 0.0–0.4)
ALPHA2 GLOB SERPL ELPH-MCNC: 0.7 g/dL (ref 0.4–1.0)
Albumin SerPl Elph-Mcnc: 3.9 g/dL (ref 2.9–4.4)
B-GLOBULIN SERPL ELPH-MCNC: 1.1 g/dL (ref 0.7–1.3)
Gamma Glob SerPl Elph-Mcnc: 1.1 g/dL (ref 0.4–1.8)
Globulin, Total: 3.1 g/dL (ref 2.2–3.9)
IGG (IMMUNOGLOBIN G), SERUM: 1101 mg/dL (ref 700–1600)
IGM, SERUM: 44 mg/dL (ref 20–172)
IgA: 283 mg/dL (ref 90–386)
TOTAL PROTEIN ELP: 7 g/dL (ref 6.0–8.5)

## 2016-02-28 ENCOUNTER — Inpatient Hospital Stay (HOSPITAL_BASED_OUTPATIENT_CLINIC_OR_DEPARTMENT_OTHER): Payer: BLUE CROSS/BLUE SHIELD | Admitting: Internal Medicine

## 2016-02-28 VITALS — BP 151/101 | HR 96 | Temp 96.9°F | Ht 72.0 in | Wt 199.5 lb

## 2016-02-28 DIAGNOSIS — R0789 Other chest pain: Secondary | ICD-10-CM

## 2016-02-28 DIAGNOSIS — D759 Disease of blood and blood-forming organs, unspecified: Secondary | ICD-10-CM | POA: Diagnosis not present

## 2016-02-28 DIAGNOSIS — Z79899 Other long term (current) drug therapy: Secondary | ICD-10-CM

## 2016-02-28 DIAGNOSIS — E1165 Type 2 diabetes mellitus with hyperglycemia: Secondary | ICD-10-CM | POA: Diagnosis not present

## 2016-02-28 DIAGNOSIS — M899 Disorder of bone, unspecified: Secondary | ICD-10-CM

## 2016-02-28 DIAGNOSIS — C9030 Solitary plasmacytoma not having achieved remission: Secondary | ICD-10-CM

## 2016-02-28 DIAGNOSIS — M25552 Pain in left hip: Secondary | ICD-10-CM

## 2016-02-28 NOTE — Progress Notes (Signed)
Pt's major complaints today are are cough and stuffy nose with an elevated BP.  Taking OTC diabetic tussin   Pt reports he had medication for BP last night.  No s/s of elevated BP.  Pt did not feel it was elevated but reports he is nervous and worried.

## 2016-02-28 NOTE — Progress Notes (Signed)
[facility] CONSULT NOTE  Patient Care Team: [facility] as PCP - General (Family Medicine)  CHIEF COMPLAINTS/PURPOSE OF CONSULTATION:   #  Oncology History   # AUG 2017 RIGHT 9th RIB ~ 7cm lesion ? 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  male with a history of insulin-dependent diabetes- And right chest wall lesion with pain is here for follow-up/to review the blood work. Patient's PET scan was declined by insurance.  He continues to have pain in the right chest wall that coughing and movement makes it worse.  He denies any nausea vomiting. Denies any weight loss. Denies any chest pain or shortness of breath or cough. No tingling and numbness. Denies any alcohol abuse.   ROS: A complete 10 point review of system is done which is negative except mentioned above in history of present illness  MEDICAL HISTORY:  Past Medical History:  Diagnosis Date  . Diabetes mellitus without complication ([place])   . Hyperlipidemia   . Hypertension   . Meningitis 2015    SURGICAL HISTORY: Past Surgical History:  Procedure Laterality Date  . 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  Social History   Social History  . Marital status: Legally Separated    Spouse name: N/A  . Number of children: N/A  . Years of education: N/A   Occupational History  . Not on file.   Social History Main Topics  . Smoking status: Never Smoker  . Smokeless tobacco: Never Used  . Alcohol use 0.0 oz/week     Comment: Rarely  . Drug use: No  . Sexual activity: Not on file   Other Topics Concern  . Not on file   Social History Narrative  . No narrative on file    FAMILY HISTORY: Family History  Problem Relation Age of Onset  . Stroke Mother   . Diabetes Mother   . Hypertension Mother    . Stroke Father   . Diabetes Father   . Hypertension Father   . Stroke Sister   . Hypertension Maternal Grandmother   . Hypertension Paternal Grandmother     ALLERGIES:  has No Known Allergies.  MEDICATIONS:  Current Outpatient Prescriptions  Medication Sig Dispense Refill  . aspirin 81 MG tablet Take 81 mg by mouth daily.    . baclofen (LIORESAL) 10 MG tablet Take 1 tablet (10 mg total) by mouth 3 (three) times daily. 30 tablet 0  . HYDROcodone-acetaminophen (NORCO/VICODIN) 5-325 MG tablet Take 1 tablet by mouth 4 (four) times daily as needed.  0  . insulin aspart protamine - aspart (NOVOLOG 70/30 MIX) (70-30) 100 UNIT/ML FlexPen 25units BID    . insulin glargine (LANTUS) 100 UNIT/ML injection Inject 20 Units into the skin at bedtime.    Marland Kitchen lisinopril (PRINIVIL,ZESTRIL) 20 MG tablet Take 20 mg by mouth daily.    . meloxicam (MOBIC) 15 MG tablet Take 1 tablet (15 mg total) by mouth daily. 30 tablet 0  . traMADol (ULTRAM) 50 MG tablet Take 1 tablet by mouth 1 day or 1 dose.     No current facility-administered medications for this visit.       Marland Kitchen  PHYSICAL EXAMINATION: ECOG PERFORMANCE STATUS: 1 - Symptomatic but completely ambulatory  Vitals:  02/28/16 1410  BP: (!) 151/101  Pulse: 96  Temp: (!) 96.9 F (36.1 C)   Filed Weights   02/28/16 1410  Weight: 199 lb 8 oz (90.5 kg)    GENERAL: Well-nourished well-developed; Alert, no distress and comfortable.   Alone.  EYES: no pallor or icterus OROPHARYNX: no thrush or ulceration; good dentition  NECK: supple, no masses felt LYMPH:  no palpable lymphadenopathy in the cervical, axillary or inguinal regions LUNGS: clear to auscultation and  No wheeze or crackles HEART/CVS: regular rate & rhythm and no murmurs; No lower extremity edema ABDOMEN: abdomen soft, non-tender and normal bowel sounds Musculoskeletal:no cyanosis of digits and no clubbing; tenderness on palpation right chest wall- around 9 rib.  PSYCH: alert &  oriented x 3 with fluent speech NEURO: no focal motor/sensory deficits SKIN:  no rashes or significant lesions;   LABORATORY DATA:  I have reviewed the data as listed Lab Results  Component Value Date   WBC 3.9 02/23/2016   HGB 13.3 02/23/2016   HCT 40.1 02/23/2016   MCV 85.3 02/23/2016   PLT 147 (L) 02/23/2016    Recent Labs  01/18/16 1417 02/16/16 1246 02/23/16 1214  NA 139 133* 137  K 4.1 4.8 3.8  CL 103 101 102  CO2 28 27 29   GLUCOSE 107* 312* 206*  BUN 27* 24* 22*  CREATININE 1.17 1.11 1.19  CALCIUM 9.8 9.2 8.7*  GFRNONAA >60 >60 >60  GFRAA >60 >60 >60  PROT 7.7  --  7.6  ALBUMIN 4.5  --  4.3  AST 20  --  22  ALT 23  --  26  ALKPHOS 61  --  73  BILITOT 0.4  --  0.5    RADIOGRAPHIC STUDIES: I have personally reviewed the radiological images as listed and agreed with the findings in the report. US Venous Img Lower Unilateral Left  Result Date: 02/16/2016 CLINICAL DATA:  Left lower extremity pain radiating from the hip to the knee for the past month. Evaluate for DVT. EXAM: LEFT LOWER EXTREMITY VENOUS DOPPLER ULTRASOUND TECHNIQUE: Gray-scale sonography with graded compression, as well as color Doppler and duplex ultrasound were performed to evaluate the lower extremity deep venous systems from the level of the common femoral vein and including the common femoral, femoral, profunda femoral, popliteal and calf veins including the posterior tibial, peroneal and gastrocnemius veins when visible. The superficial great saphenous vein was also interrogated. Spectral Doppler was utilized to evaluate flow at rest and with distal augmentation maneuvers in the common femoral, femoral and popliteal veins. COMPARISON:  None. FINDINGS: Contralateral Common Femoral Vein: Respiratory phasicity is normal and symmetric with the symptomatic side. No evidence of thrombus. Normal compressibility. Common Femoral Vein: No evidence of thrombus. Normal compressibility, respiratory phasicity and  response to augmentation. Saphenofemoral Junction: No evidence of thrombus. Normal compressibility and flow on color Doppler imaging. Profunda Femoral Vein: No evidence of thrombus. Normal compressibility and flow on color Doppler imaging. Femoral Vein: No evidence of thrombus. Normal compressibility, respiratory phasicity and response to augmentation. Popliteal Vein: No evidence of thrombus. Normal compressibility, respiratory phasicity and response to augmentation. Calf Veins: No evidence of thrombus. Normal compressibility and flow on color Doppler imaging. Superficial Great Saphenous Vein: No evidence of thrombus. Normal compressibility and flow on color Doppler imaging. Venous Reflux:  None. Other Findings:  None. IMPRESSION: No evidence of DVT within the left lower extremity. Electronically Signed   By: Sandi Mariscal M.D.   On: 02/16/2016 13:29   Dg  Bone Survey Met  Result Date: 02/23/2016 CLINICAL DATA:  Right ribcage discomfort, discomfort in the left leg from the hip to the foot, bilateral shoulder discomfort with history of torn rotator cuffs. Plasmacytoma. EXAM: METASTATIC BONE SURVEY COMPARISON:  No previous metastatic bone surveys for review. FINDINGS: Chest x-ray: The lungs are well-expanded and clear. The heart and pulmonary vascularity are normal. There is no pleural effusion. The observed ribs exhibit no lytic lesions. There is expansile remodeling of the lateral aspect of the right ninth rib which corresponds to the CT findings of January 18, 2016. Skull and spine: No calvarial lesions are observed. There are mild degenerative endplate spurs of the lower thoracic spine. Upper extremities:  No lytic or blastic bony lesions are observed. Pelvis and lower extremities: No lytic or blastic bony lesions are observed. IMPRESSION: Known expansile remodeling of the lateral aspect of the right ninth rib. No suspicious skeletal lesions otherwise. Electronically Signed   By: David  Martinique M.D.   On: 02/23/2016  13:47   IMPRESSION: 1. Expansile lucent right ninth rib lesion measuring 7.6 by 1.2 by 2.0 cm, without discrete fracture. Extremely subtle on radiography, but in retrospect I suspect unchanged from 11/17/2013, making an aggressive etiology such as metastatic disease or myeloma unlikely. Accordingly, top differential diagnostic considerations include fibrous dysplasia, enchondroma, aneurysmal bone cyst, or eosinophilic granuloma. This lesion is very subtle on radiography, but in retrospect I suspect that is not changed chest radiograph of from 11/17/2013, which is reassuring that this is likely a benign process (making metastatic disease or myeloma considerably less likely). 2. Wall thickening in the distal sigmoid colon and rectum, suggestive of a distal colitis.    ASSESSMENT & PLAN:   Rib lesion Right ninth rib lesion noted on CT scan/also noted on previous imaging 2 years ago. New-onset of pain.unclear etiology.  Myeloma work up neg.  Insurance declined PET. Will discuss at the tumor conference.  # HTN- poorly controlled. Follow up PCP.   # Pain control-continue pain ccontrol/ diabetes on insulin.  # follow-up 4 weeks/ no labs.     All questions were answered. The patient knows to call the clinic with any problems, questions or concerns.       Cammie Sickle, MD 02/28/2016 2:54 PM

## 2016-02-28 NOTE — Assessment & Plan Note (Addendum)
Right ninth rib lesion noted on CT scan/also noted on previous imaging 2 years ago. New-onset of pain.unclear etiology.  Myeloma work up/skeletal survey - neg.  Insurance declined PET. Will discuss at the tumor conference. We will inform the patient if biopsy is indicated.  # HTN- poorly controlled. Follow up PCP.   # Pain control-continue pain ccontrol/ diabetes on insulin.  # follow-up 4 weeks/ no labs.

## 2016-03-01 ENCOUNTER — Other Ambulatory Visit: Payer: Self-pay | Admitting: Internal Medicine

## 2016-03-01 DIAGNOSIS — M899 Disorder of bone, unspecified: Secondary | ICD-10-CM

## 2016-03-04 ENCOUNTER — Other Ambulatory Visit: Payer: Self-pay | Admitting: Radiology

## 2016-03-05 ENCOUNTER — Other Ambulatory Visit: Payer: Self-pay | Admitting: Physician Assistant

## 2016-03-06 ENCOUNTER — Ambulatory Visit: Admission: RE | Admit: 2016-03-06 | Payer: BLUE CROSS/BLUE SHIELD | Source: Ambulatory Visit

## 2016-03-06 ENCOUNTER — Ambulatory Visit
Admission: RE | Admit: 2016-03-06 | Discharge: 2016-03-06 | Disposition: A | Discharge status: Home / Self Care | Payer: BLUE CROSS/BLUE SHIELD | Source: Ambulatory Visit | Attending: Internal Medicine | Admitting: Internal Medicine

## 2016-03-06 DIAGNOSIS — M609 Myositis, unspecified: Secondary | ICD-10-CM | POA: Diagnosis not present

## 2016-03-06 DIAGNOSIS — M899 Disorder of bone, unspecified: Secondary | ICD-10-CM | POA: Insufficient documentation

## 2016-03-06 DIAGNOSIS — R0781 Pleurodynia: Secondary | ICD-10-CM | POA: Diagnosis not present

## 2016-03-06 LAB — CBC
HCT: 38.8 % — ABNORMAL LOW (ref 40.0–52.0)
HEMOGLOBIN: 12.9 g/dL — AB (ref 13.0–18.0)
MCH: 28.3 pg (ref 26.0–34.0)
MCHC: 33.2 g/dL (ref 32.0–36.0)
MCV: 85.2 fL (ref 80.0–100.0)
Platelets: 171 10*3/uL (ref 150–440)
RBC: 4.55 MIL/uL (ref 4.40–5.90)
RDW: 14.4 % (ref 11.5–14.5)
WBC: 3.8 10*3/uL (ref 3.8–10.6)

## 2016-03-06 LAB — APTT: aPTT: 27 seconds (ref 24–36)

## 2016-03-06 LAB — PROTIME-INR
INR: 0.9
PROTHROMBIN TIME: 12.1 s (ref 11.4–15.2)

## 2016-03-06 LAB — GLUCOSE, RANDOM: Glucose, Bld: 161 mg/dL — ABNORMAL HIGH (ref 65–99)

## 2016-03-06 MED ORDER — MIDAZOLAM HCL 5 MG/5ML IJ SOLN
INTRAMUSCULAR | Status: AC | PRN
  Administered 2016-03-06 (×2): 1 mg via INTRAVENOUS
  Administered 2016-03-06: 0.5 mg via INTRAVENOUS

## 2016-03-06 MED ORDER — HYDROCODONE-ACETAMINOPHEN 5-325 MG PO TABS
1.0000 | ORAL_TABLET | ORAL | Status: DC | PRN
  Administered 2016-03-06: 2 via ORAL

## 2016-03-06 MED ORDER — SODIUM CHLORIDE 0.9 % IV SOLN
INTRAVENOUS | Status: DC
  Administered 2016-03-06: 09:00:00 via INTRAVENOUS

## 2016-03-06 MED ORDER — FENTANYL CITRATE (PF) 100 MCG/2ML IJ SOLN
INTRAMUSCULAR | Status: AC | PRN
  Administered 2016-03-06: 25 ug via INTRAVENOUS
  Administered 2016-03-06: 50 ug via INTRAVENOUS

## 2016-03-06 NOTE — Procedures (Signed)
CT core biopsy RIGHT rib lesion No complication No blood loss. See complete dictation in [facility].

## 2016-03-08 LAB — SURGICAL PATHOLOGY

## 2016-03-19 ENCOUNTER — Ambulatory Visit (INDEPENDENT_AMBULATORY_CARE_PROVIDER_SITE_OTHER): Payer: BLUE CROSS/BLUE SHIELD | Admitting: Cardiothoracic Surgery

## 2016-03-19 ENCOUNTER — Encounter: Payer: Self-pay | Admitting: Cardiothoracic Surgery

## 2016-03-19 VITALS — BP 177/103 | HR 83 | Temp 97.8°F | Wt 205.0 lb

## 2016-03-19 DIAGNOSIS — D167 Benign neoplasm of ribs, sternum and clavicle: Secondary | ICD-10-CM | POA: Diagnosis not present

## 2016-03-19 NOTE — Patient Instructions (Signed)
Please bring [place] a copy of your Chest X-Rays from [facility] so we could put them in your chart. Then Dr. Genevive Bi will compare them and will let you know.

## 2016-03-19 NOTE — Progress Notes (Signed)
[name] Post-Op Note  Patient ID: Nathan Chan, male   DOB: June 30, 1971, 45 y.o.   MRN: [id number]  HISTORY: This patient returns today in follow-up of his right rib lesion. He describes no pain at this time. He does not have any discomfort associated with this at all. He did undergo a bone scan and a CT-guided needle biopsy as per our oncology department. The bone scan was essentially unremarkable and the biopsy did not reveal any evidence of tumor. The patient did not obtain his records from [facility] but will obtain those on Thursday and drop them off for my review. He believes he may have a CT scan or chest x-ray in that packet of information.   There were no vitals filed for this visit.   EXAM:    Resp: Lungs are clear bilaterally.  No respiratory distress, normal effort. Heart:  Regular without murmurs.  Palpation of the chest wall reveals no abnormalities. There is no tenderness. There is a small scar present at the biopsy site. Abd:  Abdomen is soft, non distended and non tender. No masses are palpable.  There is no rebound and no guarding.  Neurological: Alert and oriented to person, place, and time. Coordination normal.  Skin: Skin is warm and dry. No rash noted. No diaphoretic. No erythema. No pallor.  Psychiatric: Normal mood and affect. Normal behavior. Judgment and thought content normal.    ASSESSMENT: Benign rib process. The patient will bring his records and I will review those.   PLAN:   I gave the patient my business card and told to contact me next week so I can review with him by phone the results of the records from [facility]. He will follow-up with oncology in 4 weeks and he was told to follow-up with his primary care physician regarding his blood pressure.    Nestor Lewandowsky, MD

## 2016-03-27 ENCOUNTER — Ambulatory Visit: Payer: BLUE CROSS/BLUE SHIELD | Admitting: Internal Medicine

## 2016-03-28 ENCOUNTER — Inpatient Hospital Stay: Payer: BLUE CROSS/BLUE SHIELD | Attending: Internal Medicine | Admitting: Internal Medicine

## 2016-03-28 VITALS — BP 141/84 | HR 102 | Temp 97.4°F | Resp 19 | Ht 72.0 in | Wt 203.4 lb

## 2016-03-28 DIAGNOSIS — Z79899 Other long term (current) drug therapy: Secondary | ICD-10-CM | POA: Insufficient documentation

## 2016-03-28 DIAGNOSIS — Z7982 Long term (current) use of aspirin: Secondary | ICD-10-CM | POA: Diagnosis not present

## 2016-03-28 DIAGNOSIS — Z8661 Personal history of infections of the central nervous system: Secondary | ICD-10-CM | POA: Insufficient documentation

## 2016-03-28 DIAGNOSIS — M899 Disorder of bone, unspecified: Secondary | ICD-10-CM | POA: Diagnosis present

## 2016-03-28 DIAGNOSIS — I10 Essential (primary) hypertension: Secondary | ICD-10-CM | POA: Diagnosis not present

## 2016-03-28 DIAGNOSIS — E785 Hyperlipidemia, unspecified: Secondary | ICD-10-CM | POA: Insufficient documentation

## 2016-03-28 DIAGNOSIS — Z794 Long term (current) use of insulin: Secondary | ICD-10-CM | POA: Diagnosis not present

## 2016-03-28 DIAGNOSIS — E119 Type 2 diabetes mellitus without complications: Secondary | ICD-10-CM | POA: Diagnosis not present

## 2016-03-28 NOTE — Assessment & Plan Note (Addendum)
Right ninth rib lesion noted on CT scan/also noted on previous imaging 2 years ago. Unclear etiology. Workup initiated because of pain  # However, Myeloma work up/skeletal survey - neg. Biopsy- CT- "blood" negative for any malignancy. Discussed with Dr. Luana Shu in pathology.  # Recommend follow-up in approximately 6 months with a CT scan prior.

## 2016-03-28 NOTE — Progress Notes (Signed)
Pt reports cold like symptoms with no fevers.

## 2016-03-28 NOTE — Progress Notes (Signed)
[facility] CONSULT NOTE  Patient Care Team: [facility] as PCP - General (Family Medicine)  CHIEF COMPLAINTS/PURPOSE OF CONSULTATION:   #  Oncology History   # AUG 2017 RIGHT 9th RIB ~ 7cm lesion ? CT-guided biopsy- "blood" no malignancy. PET scan denied by insurance.     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  male with a history Of right chest wall rib lesion status post CT-guided biopsy is here for follow-up/to review the pathology.  Patient states his chest wall pain is improved.  Denies any weight loss. Denies any chest pain or shortness of breath or cough. No tingling and numbness. Denies any alcohol abuse.   ROS: A complete 10 point review of system is done which is negative except mentioned above in history of present illness  MEDICAL HISTORY:  Past Medical History:  Diagnosis Date  . Diabetes mellitus without complication ([place])   . Hyperlipidemia   . Hypertension   . Meningitis 2015    SURGICAL HISTORY: Past Surgical History:  Procedure Laterality Date  . 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 Lift furniture.  Social History   Social History  . Marital status: Legally Separated    Spouse name: N/A  . Number of children: N/A  . Years of education: N/A   Occupational History  . Not on file.   Social History Main Topics  . Smoking status: Never Smoker  . Smokeless tobacco: Never Used  . Alcohol use 0.0 oz/week     Comment: Rarely  . Drug use: No  . Sexual activity: Not on file   Other Topics Concern  . Not on file   Social History Narrative  . No narrative on file    FAMILY HISTORY: Family History  Problem Relation Age of Onset  . Stroke Mother   . Diabetes Mother   . Hypertension Mother   . Stroke Father   . Diabetes Father   . Hypertension Father   . Stroke Sister   . Hypertension Maternal Grandmother   .  Hypertension Paternal Grandmother     ALLERGIES:  has No Known Allergies.  MEDICATIONS:  Current Outpatient Prescriptions  Medication Sig Dispense Refill  . acetaminophen (TYLENOL) 325 MG tablet Take 650 mg by mouth every 6 (six) hours as needed.    Marland Kitchen amLODipine (NORVASC) 2.5 MG tablet Take 2.5 mg by mouth 1 day or 1 dose.    Marland Kitchen aspirin 81 MG tablet Take 81 mg by mouth daily.    . baclofen (LIORESAL) 10 MG tablet Take 1 tablet (10 mg total) by mouth 3 (three) times daily. 30 tablet 0  . insulin aspart protamine - aspart (NOVOLOG 70/30 MIX) (70-30) 100 UNIT/ML FlexPen 25units BID    . insulin glargine (LANTUS) 100 UNIT/ML injection Inject 20 Units into the skin at bedtime.    Marland Kitchen lisinopril (PRINIVIL,ZESTRIL) 20 MG tablet Take 20 mg by mouth daily.    . meloxicam (MOBIC) 15 MG tablet Take 1 tablet (15 mg total) by mouth daily. 30 tablet 0  . traMADol (ULTRAM) 50 MG tablet Take 1 tablet by mouth 1 day or 1 dose.     No current facility-administered medications for this visit.       Marland Kitchen  PHYSICAL EXAMINATION: ECOG PERFORMANCE STATUS: 1 - Symptomatic but completely ambulatory  Vitals:   03/28/16 1430  BP: (!) 141/84  Pulse: (!) 102  Resp: 19  Temp: 97.4 F (36.3 C)   Filed Weights   03/28/16 1430  Weight: 203 lb 6.4 oz (92.3 kg)    GENERAL: Well-nourished well-developed; Alert, no distress and comfortable.   Alone.  EYES: no pallor or icterus OROPHARYNX: no thrush or ulceration; good dentition  NECK: supple, no masses felt LYMPH:  no palpable lymphadenopathy in the cervical, axillary or inguinal regions LUNGS: clear to auscultation and  No wheeze or crackles HEART/CVS: regular rate & rhythm and no murmurs; No lower extremity edema ABDOMEN: abdomen soft, non-tender and normal bowel sounds Musculoskeletal:no cyanosis of digits and no clubbing; tenderness on palpation right chest wall- around 9 rib.  PSYCH: alert & oriented x 3 with fluent speech NEURO: no focal motor/sensory  deficits SKIN:  no rashes or significant lesions;   LABORATORY DATA:  I have reviewed the data as listed Lab Results  Component Value Date   WBC 3.8 03/06/2016   HGB 12.9 (L) 03/06/2016   HCT 38.8 (L) 03/06/2016   MCV 85.2 03/06/2016   PLT 171 03/06/2016    Recent Labs  01/18/16 1417 02/16/16 1246 02/23/16 1214 03/06/16 0809  NA 139 133* 137  --   K 4.1 4.8 3.8  --   CL 103 101 102  --   CO2 28 27 29   --   GLUCOSE 107* 312* 206* 161*  BUN 27* 24* 22*  --   CREATININE 1.17 1.11 1.19  --   CALCIUM 9.8 9.2 8.7*  --   GFRNONAA >60 >60 >60  --   GFRAA >60 >60 >60  --   PROT 7.7  --  7.6  --   ALBUMIN 4.5  --  4.3  --   AST 20  --  22  --   ALT 23  --  26  --   ALKPHOS 61  --  73  --   BILITOT 0.4  --  0.5  --     RADIOGRAPHIC STUDIES: I have personally reviewed the radiological images as listed and agreed with the findings in the report. Ct Biopsy  Result Date: 03/06/2016 CLINICAL DATA:  Expansile lucent nonaggressive appearing right ninth rib lesion, present since 2015. Regional pain. EXAM: CT GUIDED CORE BIOPSY OF RIGHT NINTH RIB LESION ANESTHESIA/SEDATION: Intravenous Fentanyl and Versed were administered as conscious sedation during continuous monitoring of the patient's level of consciousness and physiological / cardiorespiratory status by the radiology RN, with a total moderate sedation time of 33 minutes. PROCEDURE: The procedure risks, benefits, and alternatives were explained to the patient. Questions regarding the procedure were encouraged and answered. The patient understands and consents to the procedure. Select axial scans through the thorax were obtained. The expansile right ninth rib lesion was localized and an appropriate skin entry site determined and marked. The operative field was prepped with chlorhexidinein a sterile fashion, and a sterile drape was applied covering the operative field. A sterile gown and sterile gloves were used for the procedure. Local  anesthesia was provided with 1% Lidocaine. Under CT fluoroscopic guidance, an 11 gauge cook trocar bone needle was advanced into the lesion for core biopsy sample x2. Postprocedure scan shows no fracture or other apparent complication. The patient tolerated the procedure well. COMPLICATIONS: None immediate FINDINGS: Lucent expansile lesion of the lateral aspect right ninth rib was again localized. Core biopsy samples obtained as above. IMPRESSION: 1. Technically successful CT-guided core biopsy of right ninth rib bone lesion. Electronically Signed   By: Keturah Barre  Vernard Gambles M.D.   On: 03/06/2016 11:06   IMPRESSION: 1. Expansile lucent right ninth rib lesion measuring 7.6 by 1.2 by 2.0 cm, without discrete fracture. Extremely subtle on radiography, but in retrospect I suspect unchanged from 11/17/2013, making an aggressive etiology such as metastatic disease or myeloma unlikely. Accordingly, top differential diagnostic considerations include fibrous dysplasia, enchondroma, aneurysmal bone cyst, or eosinophilic granuloma. This lesion is very subtle on radiography, but in retrospect I suspect that is not changed chest radiograph of from 11/17/2013, which is reassuring that this is likely a benign process (making metastatic disease or myeloma considerably less likely). 2. Wall thickening in the distal sigmoid colon and rectum, suggestive of a distal colitis.    ASSESSMENT & PLAN:   Rib lesion Right ninth rib lesion noted on CT scan/also noted on previous imaging 2 years ago. Unclear etiology. Workup initiated because of pain  # However, Myeloma work up/skeletal survey - neg. Biopsy- CT- "blood" negative for any malignancy. Discussed with Dr. Luana Shu in pathology.  # Recommend follow-up in approximately 6 months with a CT scan prior.    All questions were answered. The patient knows to call the clinic with any problems, questions or concerns.     Cammie Sickle, MD 03/29/2016 6:11 PM

## 2016-04-08 ENCOUNTER — Emergency Department
Admission: EM | Admit: 2016-04-08 | Discharge: 2016-04-08 | Disposition: A | Discharge status: Home / Self Care | Payer: BLUE CROSS/BLUE SHIELD | Attending: Emergency Medicine | Admitting: Emergency Medicine

## 2016-04-08 ENCOUNTER — Emergency Department: Payer: BLUE CROSS/BLUE SHIELD

## 2016-04-08 ENCOUNTER — Encounter: Payer: Self-pay | Admitting: Emergency Medicine

## 2016-04-08 DIAGNOSIS — E109 Type 1 diabetes mellitus without complications: Secondary | ICD-10-CM | POA: Insufficient documentation

## 2016-04-08 DIAGNOSIS — Z79899 Other long term (current) drug therapy: Secondary | ICD-10-CM | POA: Insufficient documentation

## 2016-04-08 DIAGNOSIS — M25511 Pain in right shoulder: Secondary | ICD-10-CM | POA: Diagnosis present

## 2016-04-08 DIAGNOSIS — Z7982 Long term (current) use of aspirin: Secondary | ICD-10-CM | POA: Insufficient documentation

## 2016-04-08 DIAGNOSIS — Z794 Long term (current) use of insulin: Secondary | ICD-10-CM | POA: Diagnosis not present

## 2016-04-08 DIAGNOSIS — I10 Essential (primary) hypertension: Secondary | ICD-10-CM | POA: Diagnosis not present

## 2016-04-08 MED ORDER — KETOROLAC TROMETHAMINE 30 MG/ML IJ SOLN
30.0000 mg | Freq: Once | INTRAMUSCULAR | Status: AC
  Administered 2016-04-08: 30 mg via INTRAMUSCULAR
  Filled 2016-04-08: qty 1

## 2016-04-08 MED ORDER — NAPROXEN 500 MG PO TABS: 500.0000 mg | ORAL_TABLET | Freq: Two times a day (BID) | ORAL | 0 refills | Status: DC

## 2016-04-08 NOTE — ED Notes (Signed)
BP elevated, provider made aware, instructed to proceed with discharge.

## 2016-04-08 NOTE — ED Provider Notes (Signed)
[facility] Emergency Department Provider Note  ____________________________________________  Time seen: Approximately 2:28 PM  I have reviewed the triage vital signs and the nursing notes.   HISTORY  Chief Complaint Arm Pain    HPI Nathan Chan is a 45 y.o. male , NAD, presents to the emergency department with 2 day history of right shoulder pain. Patient states he had a rotator cuff injury 1 year ago in which was repaired by a physician in [facility]. States he has had off-and-on pain about the shoulder since that time. Has had acute pain due to lifting heavy objects while moving 2 days ago. Has been taking over-the-counter medications with no relief. Denies any numbness, weakness, tingling. Has not noted any redness, swelling or warmth to the shoulder. No injuries, falls or traumas have been noted to the shoulder. Denies chest pain, shortness of breath, abdominal pain, nausea or vomiting. Does have a history of hypertension and which is followed by his primary care provider.   Past Medical History:  Diagnosis Date  . Diabetes mellitus without complication ([place])   . Hyperlipidemia   . Hypertension   . Meningitis 2015    Patient Active Problem List   Diagnosis Date Noted  . Rib lesion 02/28/2016  . 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 Erroneous encounter - disregard 02/19/2016  . Diabetes mellitus type I ([place]) 02/13/2016  . 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 Colitis 01/22/2016  . Albuminuria 09/28/2015  . Proliferative diabetic retinopathy ([place]) 09/06/2015  . 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/12/2015  . 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 Essential hypertension 06/08/2015  . Porokeratosis 05/21/2015  . Rotator cuff tendonitis 04/17/2015  . Corn of foot 04/01/2014  . Thumb laceration 04/01/2014  . Meningitis 12/20/2013  . IP (interphalangeal) joint, sprain, hand 06/21/2013  . Closed fracture of middle or proximal phalanx  or phalanges of hand 05/16/2013    Past Surgical History:  Procedure Laterality Date  . EYE SURGERY Right 09/2015    Prior to Admission medications   Medication Sig Start Date End Date Taking? Authorizing Provider  acetaminophen (TYLENOL) 325 MG tablet Take 650 mg by mouth every 6 (six) hours as needed.    Historical Provider, MD  amLODipine (NORVASC) 2.5 MG tablet Take 2.5 mg by mouth 1 day or 1 dose. 02/29/16 02/28/17  Historical Provider, MD  aspirin 81 MG tablet Take 81 mg by mouth daily.    Historical Provider, MD  insulin aspart protamine - aspart (NOVOLOG 70/30 MIX) (70-30) 100 UNIT/ML FlexPen 25units BID 01/05/15   Historical Provider, MD  insulin glargine (LANTUS) 100 UNIT/ML injection Inject 20 Units into the skin at bedtime.    Historical Provider, MD  lisinopril (PRINIVIL,ZESTRIL) 20 MG tablet Take 20 mg by mouth daily.    Historical Provider, MD  naproxen (NAPROSYN) 500 MG tablet Take 1 tablet (500 mg total) by mouth 2 (two) times daily with a meal. 04/08/16   Saturnino Liew L Voshon Petro, PA-C  traMADol (ULTRAM) 50 MG tablet Take 1 tablet by mouth 1 day or 1 dose. 02/01/16   Historical Provider, MD    Allergies Review of patient's allergies indicates no known allergies.  Family History  Problem Relation Age of Onset  . Stroke Mother   . Diabetes Mother   . Hypertension Mother   . Stroke Father   . Diabetes Father   . Hypertension Father   . Stroke Sister   . Hypertension Maternal Grandmother   . Hypertension Paternal Grandmother  Social History Social History  Substance Use Topics  . Smoking status: Never Smoker  . Smokeless tobacco: Never Used  . Alcohol use 0.0 oz/week     Comment: Rarely     Review of Systems  Constitutional: No fever/chills Cardiovascular: No chest pain. Respiratory: No shortness of breath.  Gastrointestinal: No abdominal pain.  No nausea, vomiting Musculoskeletal: Positive right shoulder pain.  Skin: Negative for rash, redness, swelling, abnormal  warmth. Neurological: Negative for numbness, weakness, tingling. 10-point ROS otherwise negative.  ____________________________________________   PHYSICAL EXAM:  VITAL SIGNS: ED Triage Vitals  Enc Vitals Group     BP 04/08/16 1348 (!) 159/99     Pulse Rate 04/08/16 1348 (!) 101     Resp 04/08/16 1348 19     Temp 04/08/16 1348 98.3 F (36.8 C)     Temp Source 04/08/16 1348 Oral     SpO2 04/08/16 1348 98 %     Weight 04/08/16 1348 200 lb (90.7 kg)     Height 04/08/16 1348 6' (1.829 m)     Head Circumference --      Peak Flow --      Pain Score 04/08/16 1351 6     Pain Loc --      Pain Edu? --      Excl. in [place]? --      Constitutional: Alert and oriented. Well appearing and in no acute distress. Eyes: Conjunctivae are normal.  Head: Atraumatic. Neck: Supple with full range of motion Hematological/Lymphatic/Immunilogical: No cervical lymphadenopathy. Cardiovascular: Normal rate, regular rhythm. Normal S1 and S2. No murmurs, rubs, gallops Good peripheral circulation with 2+ pulses noted in the right upper extremity. Capillary refill is brisk in all digits of the right hand Respiratory: Normal respiratory effort without tachypnea or retractions. Lungs CTAB with breath sounds noted in all lung fields. Musculoskeletal: Tenderness to palpation diffusely about the right shoulder. Decreased range of motion with abduction due to pain to approximately 160. Negative Apley's on the right. Strength of bilateral upper extremities is 5 out of 5. Neurologic:  Normal speech and language. No gross focal neurologic deficits are appreciated. Sensation to light touch grossly intact by the right upper extremity. Skin:  Skin is warm, dry and intact. No rash, redness, swelling, abnormal warmth, skin sores noted. Psychiatric: Mood and affect are normal. Speech and behavior are normal. Patient exhibits appropriate insight and judgement.   ____________________________________________    LABS  None ____________________________________________  EKG  None ____________________________________________  RADIOLOGY I, Braxton Feathers, personally viewed and evaluated these images (plain radiographs) as part of my medical decision making, as well as reviewing the written report by the radiologist.  Dg Shoulder Right  Result Date: 04/08/2016 CLINICAL DATA:  Rotator cuff injury with surgery and persistent pain and limited range of motion. EXAM: RIGHT SHOULDER - 2+ VIEW COMPARISON:  None. FINDINGS: There is no evidence of fracture or dislocation. There is no evidence of arthropathy or other focal bone abnormality. Humeral acromial distance within normal limits. Soft tissues are unremarkable. IMPRESSION: Negative. Electronically Signed   By: Nelson Chimes M.D.   On: 04/08/2016 15:04    ____________________________________________    PROCEDURES  Procedure(s) performed: None   Procedures   Medications  ketorolac (TORADOL) 30 MG/ML injection 30 mg (30 mg Intramuscular Given 04/08/16 1449)     ____________________________________________   INITIAL IMPRESSION / ASSESSMENT AND PLAN / ED COURSE  Pertinent labs & imaging results that were available during my care of the patient were reviewed  by me and considered in my medical decision making (see chart for details).  Clinical Course  Comment By Time  [place] controlled substance database was accessed and shows patient received #40 Norco tablets from Dr. Roland Rack on 02/09/2016. Prior to that patient had received #80 tablets of tramadol 50 mg in January, February and April 2017 from [facility] and then another #40 tramadol 50mg  tablets from [facility] in July 2017. Braxton Feathers, PA-C 10/02 1430    Patient's diagnosis is consistent with Right shoulder pain. Patient will be discharged home with prescriptions for naproxen to take as directed. Patient is to follow up with Dr. Marry Guan in orthopedics if symptoms persist  past this treatment course. Patient is given ED precautions to return to the ED for any worsening or new symptoms.    ____________________________________________  FINAL CLINICAL IMPRESSION(S) / ED DIAGNOSES  Final diagnoses:  Right shoulder pain, unspecified chronicity      NEW MEDICATIONS STARTED DURING THIS VISIT:  Discharge Medication List as of 04/08/2016  3:29 PM    START taking these medications   Details  naproxen (NAPROSYN) 500 MG tablet Take 1 tablet (500 mg total) by mouth 2 (two) times daily with a meal., Starting Mon 04/08/2016, Print             Judithe Modest Roberts, PA-C 04/08/16 1901    Lisa Roca, MD 04/08/16 2042

## 2016-04-08 NOTE — ED Triage Notes (Signed)
Right shoulder pain from surgery over a year ago.

## 2016-04-08 NOTE — ED Notes (Signed)
States he had a possible rotator cuff injury about 1 year ago  Was given a steroid shot  ..but state he is still having pain to same shoulder w/o additional injury  No deformity noted

## 2016-04-11 ENCOUNTER — Ambulatory Visit (INDEPENDENT_AMBULATORY_CARE_PROVIDER_SITE_OTHER): Payer: BLUE CROSS/BLUE SHIELD | Admitting: Podiatry

## 2016-04-11 ENCOUNTER — Encounter: Payer: Self-pay | Admitting: Podiatry

## 2016-04-11 DIAGNOSIS — Q828 Other specified congenital malformations of skin: Secondary | ICD-10-CM

## 2016-04-11 NOTE — Progress Notes (Signed)
Patient ID: Germain Vankooten, male   DOB: 1971/05/25, 45 y.o.   MRN: [id number]  Subjective: 45 y.o. returns the office today for painful callus on the bottom of the left foot which continues to be painful mostly with pressure. Denies any redness or drainage around the area. Denies any acute changes since last appointment and no new complaints today. Denies any systemic complaints such as fevers, chills, nausea, vomiting.   Objective: AAO 3, NAD DP/PT pulses palpable, CRT less than 3 seconds Protective sensation intact with Simms Weinstein monofilament Annular hyperkeratotic lesion left foot submetatarsal4. Upon debridement there was no pinpoint bleeding and there is no evidence of verruca. This area appears to be a porokeratosis. There is no underlying ulceration, drainage or other signs of infection.  No open lesions or other pre-ulcerative lesions are identified. No other areas of tenderness bilateral lower extremities. No overlying edema, erythema, increased warmth. No pain with calf compression, swelling, warmth, erythema.  Assessment: Patient presents with symptomatic porokeratosis  Plan: -Treatment options including alternatives, risks, complications were discussed -Etiology of symptoms were discussed -Hyperkeratotic lesion sharply debrided 1 without, complications/bleeding. The area was cleaned and a pad was placed followed by salicylic acid and a bandage. Post procedure instructions were discussed. Monitor for infection. -Offloading pads dispensed.  Discussed this is going to be a likely reoccurring issue unless we can eliminate the pressure.  -Discussed daily foot inspection. If there are any changes, to call the office immediately.  -Follow-up as needed.  In the meantime, encouraged to call the office with any questions, concerns, changes symptoms.  Celesta Gentile, DPM

## 2016-04-16 ENCOUNTER — Other Ambulatory Visit: Payer: Self-pay | Admitting: Student

## 2016-04-16 DIAGNOSIS — M25511 Pain in right shoulder: Secondary | ICD-10-CM

## 2016-04-16 DIAGNOSIS — M7581 Other shoulder lesions, right shoulder: Secondary | ICD-10-CM

## 2016-04-27 ENCOUNTER — Ambulatory Visit: Admission: RE | Admit: 2016-04-27 | Payer: BLUE CROSS/BLUE SHIELD | Source: Ambulatory Visit

## 2016-05-02 ENCOUNTER — Ambulatory Visit
Admission: RE | Admit: 2016-05-02 | Discharge: 2016-05-02 | Disposition: A | Discharge status: Home / Self Care | Payer: BLUE CROSS/BLUE SHIELD | Source: Ambulatory Visit | Attending: Student | Admitting: Student

## 2016-05-02 DIAGNOSIS — M25511 Pain in right shoulder: Secondary | ICD-10-CM | POA: Insufficient documentation

## 2016-05-02 DIAGNOSIS — M67813 Other specified disorders of tendon, right shoulder: Secondary | ICD-10-CM | POA: Insufficient documentation

## 2016-05-02 DIAGNOSIS — M7501 Adhesive capsulitis of right shoulder: Secondary | ICD-10-CM | POA: Insufficient documentation

## 2016-05-02 DIAGNOSIS — M7581 Other shoulder lesions, right shoulder: Secondary | ICD-10-CM

## 2016-06-13 ENCOUNTER — Encounter: Payer: Self-pay | Admitting: Emergency Medicine

## 2016-06-13 ENCOUNTER — Emergency Department
Admission: EM | Admit: 2016-06-13 | Discharge: 2016-06-13 | Disposition: A | Discharge status: Home / Self Care | Payer: BLUE CROSS/BLUE SHIELD | Attending: Emergency Medicine | Admitting: Emergency Medicine

## 2016-06-13 DIAGNOSIS — Z7982 Long term (current) use of aspirin: Secondary | ICD-10-CM | POA: Insufficient documentation

## 2016-06-13 DIAGNOSIS — Z794 Long term (current) use of insulin: Secondary | ICD-10-CM | POA: Insufficient documentation

## 2016-06-13 DIAGNOSIS — R21 Rash and other nonspecific skin eruption: Secondary | ICD-10-CM | POA: Insufficient documentation

## 2016-06-13 DIAGNOSIS — E119 Type 2 diabetes mellitus without complications: Secondary | ICD-10-CM | POA: Diagnosis not present

## 2016-06-13 DIAGNOSIS — Z5321 Procedure and treatment not carried out due to patient leaving prior to being seen by health care provider: Secondary | ICD-10-CM | POA: Diagnosis not present

## 2016-06-13 DIAGNOSIS — I10 Essential (primary) hypertension: Secondary | ICD-10-CM | POA: Insufficient documentation

## 2016-06-13 DIAGNOSIS — Z79899 Other long term (current) drug therapy: Secondary | ICD-10-CM | POA: Insufficient documentation

## 2016-06-13 NOTE — ED Provider Notes (Signed)
Patient eloped prior to being seen by a provider. Patient was roomed, triaged, awaiting provider and left without provider contact.   Nathan Bills Farheen Pfahler, PA-C 06/13/16 1825    Orbie Pyo, MD 06/14/16 0030

## 2016-06-13 NOTE — ED Triage Notes (Signed)
Patient presents to the ED with rash to arms, back and leg x 1 week.  Patient is in no obvious distress at this time.

## 2016-06-13 NOTE — ED Notes (Signed)
Pt has itching rash on arms, legs and back. Taking tylenol without relief.  Pt alert.

## 2016-07-26 ENCOUNTER — Encounter: Payer: Self-pay | Admitting: Emergency Medicine

## 2016-07-26 ENCOUNTER — Emergency Department
Admission: EM | Admit: 2016-07-26 | Discharge: 2016-07-26 | Disposition: A | Discharge status: Home / Self Care | Payer: BLUE CROSS/BLUE SHIELD | Attending: Emergency Medicine | Admitting: Emergency Medicine

## 2016-07-26 DIAGNOSIS — E1065 Type 1 diabetes mellitus with hyperglycemia: Secondary | ICD-10-CM | POA: Insufficient documentation

## 2016-07-26 DIAGNOSIS — J101 Influenza due to other identified influenza virus with other respiratory manifestations: Secondary | ICD-10-CM

## 2016-07-26 DIAGNOSIS — J09X2 Influenza due to identified novel influenza A virus with other respiratory manifestations: Secondary | ICD-10-CM | POA: Insufficient documentation

## 2016-07-26 DIAGNOSIS — Z7982 Long term (current) use of aspirin: Secondary | ICD-10-CM | POA: Insufficient documentation

## 2016-07-26 DIAGNOSIS — I10 Essential (primary) hypertension: Secondary | ICD-10-CM | POA: Insufficient documentation

## 2016-07-26 DIAGNOSIS — R739 Hyperglycemia, unspecified: Secondary | ICD-10-CM

## 2016-07-26 DIAGNOSIS — Z79899 Other long term (current) drug therapy: Secondary | ICD-10-CM | POA: Insufficient documentation

## 2016-07-26 LAB — URINALYSIS, COMPLETE (UACMP) WITH MICROSCOPIC
Bacteria, UA: NONE SEEN
Bilirubin Urine: NEGATIVE
Glucose, UA: >=500 mg/dL — AB
Ketones, ur: 20 mg/dL — AB
Leukocytes, UA: NEGATIVE
Nitrite: NEGATIVE
Protein, ur: >=300 mg/dL — AB
Specific Gravity, Urine: 1.026 (ref 1.005–1.030)
pH: 5 (ref 5.0–8.0)

## 2016-07-26 LAB — COMPREHENSIVE METABOLIC PANEL WITH GFR
ALT: 29 U/L (ref 17–63)
AST: 26 U/L (ref 15–41)
Albumin: 4 g/dL (ref 3.5–5.0)
Alkaline Phosphatase: 61 U/L (ref 38–126)
Anion gap: 9 (ref 5–15)
BUN: 14 mg/dL (ref 6–20)
CO2: 27 mmol/L (ref 22–32)
Calcium: 8.7 mg/dL — ABNORMAL LOW (ref 8.9–10.3)
Chloride: 99 mmol/L — ABNORMAL LOW (ref 101–111)
Creatinine, Ser: 1.27 mg/dL — ABNORMAL HIGH (ref 0.61–1.24)
GFR calc Af Amer: >60 mL/min
GFR calc non Af Amer: >60 mL/min
Glucose, Bld: 256 mg/dL — ABNORMAL HIGH (ref 65–99)
Potassium: 4.2 mmol/L (ref 3.5–5.1)
Sodium: 135 mmol/L (ref 135–145)
Total Bilirubin: 0.5 mg/dL (ref 0.3–1.2)
Total Protein: 7.7 g/dL (ref 6.5–8.1)

## 2016-07-26 LAB — CBC
HCT: 43.4 % (ref 40.0–52.0)
Hemoglobin: 14.1 g/dL (ref 13.0–18.0)
MCH: 27.9 pg (ref 26.0–34.0)
MCHC: 32.6 g/dL (ref 32.0–36.0)
MCV: 85.7 fL (ref 80.0–100.0)
Platelets: 108 10*3/uL — ABNORMAL LOW (ref 150–440)
RBC: 5.06 MIL/uL (ref 4.40–5.90)
RDW: 14.1 % (ref 11.5–14.5)
WBC: 6.2 10*3/uL (ref 3.8–10.6)

## 2016-07-26 LAB — GLUCOSE, CAPILLARY: Glucose-Capillary: 255 mg/dL — ABNORMAL HIGH (ref 65–99)

## 2016-07-26 MED ORDER — KETOROLAC TROMETHAMINE 30 MG/ML IJ SOLN
30.0000 mg | Freq: Once | INTRAMUSCULAR | Status: AC
  Administered 2016-07-26: 30 mg via INTRAVENOUS
  Filled 2016-07-26: qty 1

## 2016-07-26 MED ORDER — HYDROCOD POLST-CPM POLST ER 10-8 MG/5ML PO SUER
5.0000 mL | Freq: Once | ORAL | Status: AC
  Administered 2016-07-26: 5 mL via ORAL
  Filled 2016-07-26 (×2): qty 5

## 2016-07-26 MED ORDER — SODIUM CHLORIDE 0.9 % IV SOLN
Freq: Once | INTRAVENOUS | Status: AC
  Administered 2016-07-26: 14:00:00 via INTRAVENOUS

## 2016-07-26 MED ORDER — ONDANSETRON HCL 4 MG/2ML IJ SOLN
4.0000 mg | Freq: Once | INTRAMUSCULAR | Status: AC
  Administered 2016-07-26: 4 mg via INTRAVENOUS
  Filled 2016-07-26: qty 2

## 2016-07-26 MED ORDER — ONDANSETRON 4 MG PO TBDP: 4.0000 mg | ORAL_TABLET | Freq: Three times a day (TID) | ORAL | 0 refills | Status: DC | PRN

## 2016-07-26 MED ORDER — HYDROCOD POLST-CPM POLST ER 10-8 MG/5ML PO SUER: 5.0000 mL | Freq: Two times a day (BID) | ORAL | 0 refills | Status: DC

## 2016-07-26 NOTE — ED Triage Notes (Signed)
Pt reports cough, body aches and N/V x2 days. Pt with hx of DM, reports he hasn't been able to eat or drink.

## 2016-07-26 NOTE — ED Notes (Signed)
Patient was given a specimen cup but did not have to use the restroom at this time.

## 2016-07-26 NOTE — ED Provider Notes (Signed)
[facility] Emergency Department Provider Note        Time seen: ----------------------------------------- 2:04 PM on 07/26/2016 -----------------------------------------    I have reviewed the triage vital signs and the nursing notes.   HISTORY  Chief Complaint Cough and Emesis    HPI Nathan Chan is a 46 y.o. male who presents to ER for cough, body aches as well as vomiting for the last 2 days. He has a history of diabetes, reports she's not been able to eat or drink anything.Patient reports his A1c is around 11, blood sugar is usually elevated. He describes specific flu symptoms such as fevers, chills, bodyaches, cough as well as vomiting and diarrhea. Patient states his girlfriend was sick with the flu last week.   Past Medical History:  Diagnosis Date  . Diabetes mellitus without complication ([place])   . Hyperlipidemia   . Hypertension   . Meningitis 2015    Patient Active Problem List   Diagnosis Date Noted  . Rib lesion 02/28/2016  . 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 Erroneous encounter - disregard 02/19/2016  . Diabetes mellitus type I ([place]) 02/13/2016  . 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 Colitis 01/22/2016  . Albuminuria 09/28/2015  . Proliferative diabetic retinopathy ([place]) 09/06/2015  . 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/12/2015  . 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 Essential hypertension 06/08/2015  . Porokeratosis 05/21/2015  . Rotator cuff tendonitis 04/17/2015  . Corn of foot 04/01/2014  . Thumb laceration 04/01/2014  . Meningitis 12/20/2013  . IP (interphalangeal) joint, sprain, hand 06/21/2013  . Closed fracture of middle or proximal phalanx or phalanges of hand 05/16/2013    Past Surgical History:  Procedure Laterality Date  . EYE SURGERY Right 09/2015    Allergies Patient has no known allergies.  Social History Social History  Substance Use Topics  . Smoking status: Never Smoker  . Smokeless  tobacco: Never Used  . Alcohol use 0.0 oz/week     Comment: Rarely    Review of Systems Constitutional: Positive for fevers, chills, aches Cardiovascular: Negative for chest pain. Respiratory: Negative for shortness of breath. Positive for cough Gastrointestinal: Negative for abdominal pain, positive for vomiting Genitourinary: Negative for dysuria. Musculoskeletal: Positive for muscle aches Skin: Negative for rash. Neurological: Positive for headache and weakness  10-point ROS otherwise negative.  ____________________________________________   PHYSICAL EXAM:  VITAL SIGNS: ED Triage Vitals [07/26/16 1159]  Enc Vitals Group     BP (!) 144/83     Pulse Rate (!) 102     Resp 20     Temp 99.5 F (37.5 C)     Temp Source Oral     SpO2 97 %     Weight 200 lb (90.7 kg)     Height 6' (1.829 m)     Head Circumference      Peak Flow      Pain Score 6     Pain Loc      Pain Edu?      Excl. in [place]?     Constitutional: Alert and oriented. Well appearing and in no distress. Eyes: Conjunctivae are normal. PERRL. Normal extraocular movements. ENT   Head: Normocephalic and atraumatic.   Nose: No congestion/rhinnorhea.   Mouth/Throat: Mucous membranes are moist.   Neck: No stridor. Cardiovascular: Normal rate, regular rhythm. No murmurs, rubs, or gallops. Respiratory: Normal respiratory effort without tachypnea nor retractions. Breath sounds are clear and equal bilaterally. No wheezes/rales/rhonchi. Gastrointestinal: Soft and nontender. Normal bowel  sounds Musculoskeletal: Nontender with normal range of motion in all extremities. No lower extremity tenderness nor edema. Neurologic:  Normal speech and language. No gross focal neurologic deficits are appreciated.  Skin:  Skin is warm, dry and intact. No rash noted. Psychiatric: Mood and affect are normal. Speech and behavior are normal.  ____________________________________________  ED COURSE:  Pertinent labs &  imaging results that were available during my care of the patient were reviewed by me and considered in my medical decision making (see chart for details). Patient presents clinically with influenza. We'll treat symptomatically. He has declined Tamiflu.  Procedures ____________________________________________   LABS (pertinent positives/negatives)  Labs Reviewed  GLUCOSE, CAPILLARY - Abnormal; Notable for the following:       Result Value   Glucose-Capillary 255 (*)    All other components within normal limits  COMPREHENSIVE METABOLIC PANEL - Abnormal; Notable for the following:    Chloride 99 (*)    Glucose, Bld 256 (*)    Creatinine, Ser 1.27 (*)    Calcium 8.7 (*)    All other components within normal limits  CBC - Abnormal; Notable for the following:    Platelets 108 (*)    All other components within normal limits  URINALYSIS, COMPLETE (UACMP) WITH MICROSCOPIC - Abnormal; Notable for the following:    Color, Urine YELLOW (*)    APPearance CLEAR (*)    Glucose, UA >=500 (*)    Hgb urine dipstick SMALL (*)    Ketones, ur 20 (*)    Protein, ur >=300 (*)    Squamous Epithelial / LPF 0-5 (*)    All other components within normal limits  CBG MONITORING, ED  ____________________________________________  FINAL ASSESSMENT AND PLAN  Influenza, hyperglycemia  Plan: Patient with labs as dictated above. Patient is in no distress, he was given IV fluids as well as Zofran and Toradol. Clinically he has influenza. He'll be treated symptomatically, he is encouraged to have close outpatient follow-up for his hyperglycemia.   Earleen Newport, MD   Note: This note was generated in part or whole with voice recognition software. Voice recognition is usually quite accurate but there are transcription errors that can and very often do occur. I apologize for any typographical errors that were not detected and corrected.     Earleen Newport, MD 07/26/16 [id number]

## 2016-07-26 NOTE — ED Notes (Signed)
Pt alert and oriented X4, active, cooperative, pt in NAD. RR even and unlabored, color WNL.    

## 2016-08-02 ENCOUNTER — Emergency Department
Admission: EM | Admit: 2016-08-02 | Discharge: 2016-08-02 | Disposition: A | Discharge status: Home / Self Care | Payer: Self-pay | Attending: Student in an Organized Health Care Education/Training Program | Admitting: Student in an Organized Health Care Education/Training Program

## 2016-08-02 ENCOUNTER — Emergency Department: Payer: Self-pay

## 2016-08-02 ENCOUNTER — Encounter: Payer: Self-pay | Admitting: Emergency Medicine

## 2016-08-02 DIAGNOSIS — J069 Acute upper respiratory infection, unspecified: Secondary | ICD-10-CM | POA: Insufficient documentation

## 2016-08-02 DIAGNOSIS — Z79899 Other long term (current) drug therapy: Secondary | ICD-10-CM | POA: Insufficient documentation

## 2016-08-02 DIAGNOSIS — E119 Type 2 diabetes mellitus without complications: Secondary | ICD-10-CM | POA: Insufficient documentation

## 2016-08-02 DIAGNOSIS — Z794 Long term (current) use of insulin: Secondary | ICD-10-CM | POA: Insufficient documentation

## 2016-08-02 DIAGNOSIS — I10 Essential (primary) hypertension: Secondary | ICD-10-CM | POA: Insufficient documentation

## 2016-08-02 DIAGNOSIS — B9789 Other viral agents as the cause of diseases classified elsewhere: Secondary | ICD-10-CM

## 2016-08-02 MED ORDER — KETOROLAC TROMETHAMINE 60 MG/2ML IM SOLN
60.0000 mg | Freq: Once | INTRAMUSCULAR | Status: AC
  Administered 2016-08-02: 60 mg via INTRAMUSCULAR
  Filled 2016-08-02: qty 2

## 2016-08-02 MED ORDER — PSEUDOEPH-BROMPHEN-DM 30-2-10 MG/5ML PO SYRP: 5.0000 mL | ORAL_SOLUTION | Freq: Four times a day (QID) | ORAL | 0 refills | Status: DC | PRN

## 2016-08-02 MED ORDER — IBUPROFEN 600 MG PO TABS: 600.0000 mg | ORAL_TABLET | Freq: Three times a day (TID) | ORAL | 0 refills | Status: DC | PRN

## 2016-08-02 NOTE — ED Notes (Signed)
Pt in XR. 

## 2016-08-02 NOTE — ED Provider Notes (Signed)
[facility] Emergency Department Provider Note   ____________________________________________   First MD Initiated Contact with Patient 08/02/16 1207     (approximate)  I have reviewed the triage vital signs and the nursing notes.   HISTORY  Chief Complaint Cough and Nasal Congestion    HPI Nathan Chan is a 46 y.o. male patient states continue bodyaches, flulike symptoms since last visit on 07/26/2016. Patient given a prescription for Tamiflu and cough medications. Patient did not pick up the Tamiflu.Patient states test next cough medicine helps but is out of this medication at this time. Patient is denying nausea, vomiting or diarrhea at this time. No palliative measures for his complaint. Patient rates his pain as a 7/10. Patient described a pain as "achy".  Past Medical History:  Diagnosis Date  . Diabetes mellitus without complication ([place])   . Hyperlipidemia   . Hypertension   . Meningitis 2015    Patient Active Problem List   Diagnosis Date Noted  . Rib lesion 02/28/2016  . 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 Erroneous encounter - disregard 02/19/2016  . Diabetes mellitus type I ([place]) 02/13/2016  . 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 Colitis 01/22/2016  . Albuminuria 09/28/2015  . Proliferative diabetic retinopathy ([place]) 09/06/2015  . 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/12/2015  . 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 Essential hypertension 06/08/2015  . Porokeratosis 05/21/2015  . Rotator cuff tendonitis 04/17/2015  . Corn of foot 04/01/2014  . Thumb laceration 04/01/2014  . Meningitis 12/20/2013  . IP (interphalangeal) joint, sprain, hand 06/21/2013  . Closed fracture of middle or proximal phalanx or phalanges of hand 05/16/2013    Past Surgical History:  Procedure Laterality Date  . EYE SURGERY Right 09/2015    Prior to Admission medications   Medication Sig Start Date End Date Taking? Authorizing Provider    acetaminophen (TYLENOL) 325 MG tablet Take 650 mg by mouth every 6 (six) hours as needed.    Historical Provider, MD  amLODipine (NORVASC) 2.5 MG tablet Take 2.5 mg by mouth 1 day or 1 dose. 02/29/16 02/28/17  Historical Provider, MD  aspirin 81 MG tablet Take 81 mg by mouth daily.    Historical Provider, MD  brompheniramine-pseudoephedrine-DM 30-2-10 MG/5ML syrup Take 5 mLs by mouth 4 (four) times daily as needed. 08/02/16   Sable Feil, PA-C  chlorpheniramine-HYDROcodone [facility]) 10-8 MG/5ML SUER Take 5 mLs by mouth 2 (two) times daily. 07/26/16   Earleen Newport, MD  ibuprofen (ADVIL,MOTRIN) 600 MG tablet Take 1 tablet (600 mg total) by mouth every 8 (eight) hours as needed. 08/02/16   Sable Feil, PA-C  insulin aspart protamine - aspart (NOVOLOG 70/30 MIX) (70-30) 100 UNIT/ML FlexPen 25units BID 01/05/15   Historical Provider, MD  insulin glargine (LANTUS) 100 UNIT/ML injection Inject 20 Units into the skin at bedtime.    Historical Provider, MD  lisinopril (PRINIVIL,ZESTRIL) 20 MG tablet Take 20 mg by mouth daily.    Historical Provider, MD  naproxen (NAPROSYN) 500 MG tablet Take 1 tablet (500 mg total) by mouth 2 (two) times daily with a meal. 04/08/16   Jami L Hagler, PA-C  ondansetron (ZOFRAN ODT) 4 MG disintegrating tablet Take 1 tablet (4 mg total) by mouth every 8 (eight) hours as needed for nausea or vomiting. 07/26/16   Earleen Newport, MD  traMADol (ULTRAM) 50 MG tablet Take 1 tablet by mouth 1 day or 1 dose. 02/01/16   Historical Provider, MD  Allergies Patient has no known allergies.  Family History  Problem Relation Age of Onset  . Stroke Mother   . Diabetes Mother   . Hypertension Mother   . Stroke Father   . Diabetes Father   . Hypertension Father   . Stroke Sister   . Hypertension Maternal Grandmother   . Hypertension Paternal Grandmother     Social History Social History  Substance Use Topics  . Smoking status: Never Smoker  .  Smokeless tobacco: Never Used  . Alcohol use 0.0 oz/week     Comment: Rarely    Review of Systems Constitutional: Bodyaches but denies fever.  Eyes: No visual changes. ENT: No sore throat. Cardiovascular: Denies chest pain. Respiratory: Denies shortness of breath. Nonproductive cough Gastrointestinal: No abdominal pain.  No nausea, no vomiting.  No diarrhea.  No constipation. Genitourinary: Negative for dysuria. Musculoskeletal: Negative for back pain. Skin: Negative for rash. Neurological: Negative for headaches, focal weakness or numbness.    ____________________________________________   PHYSICAL EXAM:  VITAL SIGNS: ED Triage Vitals  Enc Vitals Group     BP 08/02/16 1131 133/81     Pulse Rate 08/02/16 1131 85     Resp 08/02/16 1131 18     Temp 08/02/16 1131 98.3 F (36.8 C)     Temp Source 08/02/16 1131 Oral     SpO2 08/02/16 1131 96 %     Weight 08/02/16 1131 200 lb (90.7 kg)     Height 08/02/16 1131 6' (1.829 m)     Head Circumference --      Peak Flow --      Pain Score 08/02/16 1132 7     Pain Loc --      Pain Edu? --      Excl. in [place]? --     Constitutional: Alert and oriented. Well appearing and in no acute distress. Eyes: Conjunctivae are normal. PERRL. EOMI. Head: Atraumatic. Nose: No congestion/rhinnorhea. Mouth/Throat: Mucous membranes are moist.  Oropharynx non-erythematous. Neck: No stridor.  No cervical spine tenderness to palpation. Hematological/Lymphatic/Immunilogical: No cervical lymphadenopathy. Cardiovascular: Normal rate, regular rhythm. Grossly normal heart sounds.  Good peripheral circulation. Respiratory: Normal respiratory effort.  No retractions. Lungs with Rales and nonproductive cough Gastrointestinal: Soft and nontender. No distention. No abdominal bruits. No CVA tenderness. Musculoskeletal: No lower extremity tenderness nor edema.  No joint effusions. Neurologic:  Normal speech and language. No gross focal neurologic deficits are  appreciated. No gait instability. Skin:  Skin is warm, dry and intact. No rash noted. Psychiatric: Mood and affect are normal. Speech and behavior are normal.  ____________________________________________   LABS (all labs ordered are listed, but only abnormal results are displayed)  Labs Reviewed - No data to display ____________________________________________  EKG   ____________________________________________  RADIOLOGY  No acute findings on chest x-ray ____________________________________________   PROCEDURES  Procedure(s) performed: None  Procedures  Critical Care performed: No  ____________________________________________   INITIAL IMPRESSION / ASSESSMENT AND PLAN / ED COURSE  Pertinent labs & imaging results that were available during my care of the patient were reviewed by me and considered in my medical decision making (see chart for details).  Viral upper rest or infection with cough. Discussed patient rationale starting antibiotics. Patient given discharge Instructions. Patient given a work note and advised follow-up family doctor condition persists. He is given a prescription for Bromfed-DM and ibuprofen.      ____________________________________________   FINAL CLINICAL IMPRESSION(S) / ED DIAGNOSES  Final diagnoses:  Viral URI with cough  NEW MEDICATIONS STARTED DURING THIS VISIT:  New Prescriptions   BROMPHENIRAMINE-PSEUDOEPHEDRINE-DM 30-2-10 MG/5ML SYRUP    Take 5 mLs by mouth 4 (four) times daily as needed.   IBUPROFEN (ADVIL,MOTRIN) 600 MG TABLET    Take 1 tablet (600 mg total) by mouth every 8 (eight) hours as needed.     Note:  This document was prepared using Dragon voice recognition software and may include unintentional dictation errors.    Sable Feil, PA-C 08/02/16 [place], MD 08/02/16 [phone]

## 2016-08-02 NOTE — ED Triage Notes (Addendum)
Pt reports continued sxs of the flu, body aches, cough and chills.  Cough is non-productive and worse at night.  Pt was given RX for cough medication which he states he used. Used multiple OTC medications with no relief.

## 2016-09-23 ENCOUNTER — Ambulatory Visit: Admission: RE | Admit: 2016-09-23 | Payer: Self-pay | Source: Ambulatory Visit

## 2016-09-25 ENCOUNTER — Inpatient Hospital Stay: Payer: Medicaid Other | Admitting: Internal Medicine

## 2016-09-25 ENCOUNTER — Inpatient Hospital Stay: Payer: Medicaid Other | Attending: Internal Medicine

## 2016-09-25 NOTE — Progress Notes (Deleted)
[facility] CONSULT NOTE  Patient Care Team: [facility] as PCP - General (Family Medicine)  CHIEF COMPLAINTS/PURPOSE OF CONSULTATION:   #  Oncology History   # AUG 2017 RIGHT 9th RIB ~ 7cm lesion ? CT-guided biopsy- "blood" no malignancy. PET scan denied by insurance.     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  male with a history Of right chest wall rib lesion status post CT-guided biopsy is here for follow-up/to review the pathology.  Patient states his chest wall pain is improved.  Denies any weight loss. Denies any chest pain or shortness of breath or cough. No tingling and numbness. Denies any alcohol abuse.   ROS: A complete 10 point review of system is done which is negative except mentioned above in history of present illness  MEDICAL HISTORY:  Past Medical History:  Diagnosis Date  . Diabetes mellitus without complication ([place])   . Hyperlipidemia   . Hypertension   . Meningitis 2015    SURGICAL HISTORY: Past Surgical History:  Procedure Laterality Date  . 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  Social History   Social History  . Marital status: Legally Separated    Spouse name: N/A  . Number of children: N/A  . Years of education: N/A   Occupational History  . Not on file.   Social History Main Topics  . Smoking status: Never Smoker  . Smokeless tobacco: Never Used  . Alcohol use 0.0 oz/week     Comment: Rarely  . Drug use: No  . Sexual activity: Not on file   Other Topics Concern  . Not on file   Social History Narrative  . No narrative on file    FAMILY HISTORY: Family History  Problem Relation Age of Onset  . Stroke Mother   . Diabetes Mother   . Hypertension Mother   . Stroke Father   . Diabetes Father   . Hypertension Father   . Stroke Sister   . Hypertension Maternal Grandmother   .  Hypertension Paternal Grandmother     ALLERGIES:  has No Known Allergies.  MEDICATIONS:  Current Outpatient Prescriptions  Medication Sig Dispense Refill  . acetaminophen (TYLENOL) 325 MG tablet Take 650 mg by mouth every 6 (six) hours as needed.    Marland Kitchen amLODipine (NORVASC) 2.5 MG tablet Take 2.5 mg by mouth 1 day or 1 dose.    Marland Kitchen aspirin 81 MG tablet Take 81 mg by mouth daily.    . brompheniramine-pseudoephedrine-DM 30-2-10 MG/5ML syrup Take 5 mLs by mouth 4 (four) times daily as needed. 120 mL 0  . chlorpheniramine-HYDROcodone (TUSSIONEX PENNKINETIC ER) 10-8 MG/5ML SUER Take 5 mLs by mouth 2 (two) times daily. 140 mL 0  . ibuprofen (ADVIL,MOTRIN) 600 MG tablet Take 1 tablet (600 mg total) by mouth every 8 (eight) hours as needed. 15 tablet 0  . insulin aspart protamine - aspart (NOVOLOG 70/30 MIX) (70-30) 100 UNIT/ML FlexPen 25units BID    . insulin glargine (LANTUS) 100 UNIT/ML injection Inject 20 Units into the skin at bedtime.    Marland Kitchen lisinopril (PRINIVIL,ZESTRIL) 20 MG tablet Take 20 mg by mouth daily.    . naproxen (NAPROSYN) 500 MG tablet Take 1 tablet (500 mg total) by mouth 2 (two) times daily with a meal. 14 tablet 0  . ondansetron (ZOFRAN ODT) 4 MG disintegrating tablet  Take 1 tablet (4 mg total) by mouth every 8 (eight) hours as needed for nausea or vomiting. 20 tablet 0  . traMADol (ULTRAM) 50 MG tablet Take 1 tablet by mouth 1 day or 1 dose.     No current facility-administered medications for this visit.       Marland Kitchen  PHYSICAL EXAMINATION: ECOG PERFORMANCE STATUS: 1 - Symptomatic but completely ambulatory  There were no vitals filed for this visit. There were no vitals filed for this visit.  GENERAL: Well-nourished well-developed; Alert, no distress and comfortable.   Alone.  EYES: no pallor or icterus OROPHARYNX: no thrush or ulceration; good dentition  NECK: supple, no masses felt LYMPH:  no palpable lymphadenopathy in the cervical, axillary or inguinal regions LUNGS:  clear to auscultation and  No wheeze or crackles HEART/CVS: regular rate & rhythm and no murmurs; No lower extremity edema ABDOMEN: abdomen soft, non-tender and normal bowel sounds Musculoskeletal:no cyanosis of digits and no clubbing; tenderness on palpation right chest wall- around 9 rib.  PSYCH: alert & oriented x 3 with fluent speech NEURO: no focal motor/sensory deficits SKIN:  no rashes or significant lesions;   LABORATORY DATA:  I have reviewed the data as listed Lab Results  Component Value Date   WBC 6.2 07/26/2016   HGB 14.1 07/26/2016   HCT 43.4 07/26/2016   MCV 85.7 07/26/2016   PLT 108 (L) 07/26/2016    Recent Labs  01/18/16 1417 02/16/16 1246 02/23/16 1214 03/06/16 0809 07/26/16 1204  NA 139 133* 137  --  135  K 4.1 4.8 3.8  --  4.2  CL 103 101 102  --  99*  CO2 28 27 29   --  27  GLUCOSE 107* 312* 206* 161* 256*  BUN 27* 24* 22*  --  14  CREATININE 1.17 1.11 1.19  --  1.27*  CALCIUM 9.8 9.2 8.7*  --  8.7*  GFRNONAA >60 >60 >60  --  >60  GFRAA >60 >60 >60  --  >60  PROT 7.7  --  7.6  --  7.7  ALBUMIN 4.5  --  4.3  --  4.0  AST 20  --  22  --  26  ALT 23  --  26  --  29  ALKPHOS 61  --  73  --  61  BILITOT 0.4  --  0.5  --  0.5    RADIOGRAPHIC STUDIES: I have personally reviewed the radiological images as listed and agreed with the findings in the report. No results found. IMPRESSION: 1. Expansile lucent right ninth rib lesion measuring 7.6 by 1.2 by 2.0 cm, without discrete fracture. Extremely subtle on radiography, but in retrospect I suspect unchanged from 11/17/2013, making an aggressive etiology such as metastatic disease or myeloma unlikely. Accordingly, top differential diagnostic considerations include fibrous dysplasia, enchondroma, aneurysmal bone cyst, or eosinophilic granuloma. This lesion is very subtle on radiography, but in retrospect I suspect that is not changed chest radiograph of from 11/17/2013, which is reassuring that this is  likely a benign process (making metastatic disease or myeloma considerably less likely). 2. Wall thickening in the distal sigmoid colon and rectum, suggestive of a distal colitis.    ASSESSMENT & PLAN:   No problem-specific Assessment & Plan notes found for this encounter.   All questions were answered. The patient knows to call the clinic with any problems, questions or concerns.     Cammie Sickle, MD 09/25/2016 2:07 PM

## 2016-09-30 ENCOUNTER — Emergency Department
Admission: EM | Admit: 2016-09-30 | Discharge: 2016-09-30 | Disposition: A | Discharge status: Home / Self Care | Payer: Medicaid Other | Attending: Emergency Medicine | Admitting: Emergency Medicine

## 2016-09-30 ENCOUNTER — Encounter: Payer: Self-pay | Admitting: *Deleted

## 2016-09-30 DIAGNOSIS — I10 Essential (primary) hypertension: Secondary | ICD-10-CM | POA: Insufficient documentation

## 2016-09-30 DIAGNOSIS — E119 Type 2 diabetes mellitus without complications: Secondary | ICD-10-CM | POA: Insufficient documentation

## 2016-09-30 DIAGNOSIS — Z7982 Long term (current) use of aspirin: Secondary | ICD-10-CM | POA: Insufficient documentation

## 2016-09-30 DIAGNOSIS — Z794 Long term (current) use of insulin: Secondary | ICD-10-CM | POA: Insufficient documentation

## 2016-09-30 DIAGNOSIS — J40 Bronchitis, not specified as acute or chronic: Secondary | ICD-10-CM | POA: Insufficient documentation

## 2016-09-30 DIAGNOSIS — Z79899 Other long term (current) drug therapy: Secondary | ICD-10-CM | POA: Insufficient documentation

## 2016-09-30 MED ORDER — PREDNISONE 10 MG (21) PO TBPK: ORAL_TABLET | ORAL | 0 refills | Status: DC

## 2016-09-30 MED ORDER — AZITHROMYCIN 250 MG PO TABS: ORAL_TABLET | ORAL | 0 refills | Status: DC

## 2016-09-30 NOTE — ED Provider Notes (Signed)
[facility] Emergency Department Provider Note  ____________________________________________  Time seen: Approximately 4:42 PM  I have reviewed the triage vital signs and the nursing notes.   HISTORY  Chief Complaint Cough and Nasal Congestion    HPI Nathan Chan is a 46 y.o. male presents to the emergency department with 2 months of congestion and nonproductive cough. He came to the emergency department because cough worsened today. Patient is eating and drinking normally. Patient has taken DayQuil and NyQuil for symptoms. Patient has several sick contacts with similar symptoms. Patient denies fever, shortness of breath, chest pain, nausea, vomiting, abdominal pain, fatigue, muscle aches.   Past Medical History:  Diagnosis Date  . Diabetes mellitus without complication ([place])   . Hyperlipidemia   . Hypertension   . Meningitis 2015    Patient Active Problem List   Diagnosis Date Noted  . Rib lesion 02/28/2016  . 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 Erroneous encounter - disregard 02/19/2016  . Diabetes mellitus type I ([place]) 02/13/2016  . 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 Colitis 01/22/2016  . Albuminuria 09/28/2015  . Proliferative diabetic retinopathy ([place]) 09/06/2015  . 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/12/2015  . 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 Essential hypertension 06/08/2015  . Porokeratosis 05/21/2015  . Rotator cuff tendonitis 04/17/2015  . Corn of foot 04/01/2014  . Thumb laceration 04/01/2014  . Meningitis 12/20/2013  . IP (interphalangeal) joint, sprain, hand 06/21/2013  . Closed fracture of middle or proximal phalanx or phalanges of hand 05/16/2013    Past Surgical History:  Procedure Laterality Date  . EYE SURGERY Right 09/2015    Prior to Admission medications   Medication Sig Start Date End Date Taking? Authorizing Provider  acetaminophen (TYLENOL) 325 MG tablet Take 650 mg by mouth every 6 (six) hours  as needed.    Historical Provider, MD  amLODipine (NORVASC) 2.5 MG tablet Take 2.5 mg by mouth 1 day or 1 dose. 02/29/16 02/28/17  Historical Provider, MD  aspirin 81 MG tablet Take 81 mg by mouth daily.    Historical Provider, MD  azithromycin (ZITHROMAX Z-PAK) 250 MG tablet Take 2 tablets (500 mg) on  Day 1,  followed by 1 tablet (250 mg) once daily on Days 2 through 5. 09/30/16   Laban Emperor, PA-C  brompheniramine-pseudoephedrine-DM 30-2-10 MG/5ML syrup Take 5 mLs by mouth 4 (four) times daily as needed. 08/02/16   Sable Feil, PA-C  chlorpheniramine-HYDROcodone [facility]) 10-8 MG/5ML SUER Take 5 mLs by mouth 2 (two) times daily. 07/26/16   Earleen Newport, MD  ibuprofen (ADVIL,MOTRIN) 600 MG tablet Take 1 tablet (600 mg total) by mouth every 8 (eight) hours as needed. 08/02/16   Sable Feil, PA-C  insulin aspart protamine - aspart (NOVOLOG 70/30 MIX) (70-30) 100 UNIT/ML FlexPen 25units BID 01/05/15   Historical Provider, MD  insulin glargine (LANTUS) 100 UNIT/ML injection Inject 20 Units into the skin at bedtime.    Historical Provider, MD  lisinopril (PRINIVIL,ZESTRIL) 20 MG tablet Take 20 mg by mouth daily.    Historical Provider, MD  naproxen (NAPROSYN) 500 MG tablet Take 1 tablet (500 mg total) by mouth 2 (two) times daily with a meal. 04/08/16   Jami L Hagler, PA-C  ondansetron (ZOFRAN ODT) 4 MG disintegrating tablet Take 1 tablet (4 mg total) by mouth every 8 (eight) hours as needed for nausea or vomiting. 07/26/16   Earleen Newport, MD  predniSONE (STERAPRED UNI-PAK 21 TAB) 10 MG (21)  TBPK tablet Take 6 tablets on day 1, take 5 tablets on day 2, take 4 tablets on day 3, take 3 tablets on day 4, take 2 tablets on day 5, take 1 tablet on day 6 09/30/16   Laban Emperor, PA-C  traMADol (ULTRAM) 50 MG tablet Take 1 tablet by mouth 1 day or 1 dose. 02/01/16   Historical Provider, MD    Allergies Patient has no known allergies.  Family History  Problem Relation Age of  Onset  . Stroke Mother   . Diabetes Mother   . Hypertension Mother   . Stroke Father   . Diabetes Father   . Hypertension Father   . Stroke Sister   . Hypertension Maternal Grandmother   . Hypertension Paternal Grandmother     Social History Social History  Substance Use Topics  . Smoking status: Never Smoker  . Smokeless tobacco: Never Used  . Alcohol use 0.0 oz/week     Comment: Rarely     Review of Systems  Constitutional: No fever/chills ENT: Positive for congestion and rhinorrhea. Cardiovascular: No chest pain. Respiratory: Positive for cough. No SOB. Gastrointestinal: No abdominal pain.  No nausea, no vomiting.  No diarrhea.  No constipation. Musculoskeletal: Negative for musculoskeletal pain. Skin: Negative for rash, abrasions, lacerations, ecchymosis. Neurological: Negative for headaches.   ____________________________________________   PHYSICAL EXAM:  VITAL SIGNS: ED Triage Vitals  Enc Vitals Group     BP 09/30/16 1447 (!) 147/83     Pulse Rate 09/30/16 1447 100     Resp 09/30/16 1447 18     Temp 09/30/16 1447 98.8 F (37.1 C)     Temp Source 09/30/16 1447 Oral     SpO2 09/30/16 1447 100 %     Weight 09/30/16 1444 200 lb (90.7 kg)     Height 09/30/16 1444 6' (1.829 m)     Head Circumference --      Peak Flow --      Pain Score 09/30/16 1444 9     Pain Loc --      Pain Edu? --      Excl. in [place]? --      Constitutional: Alert and oriented. Well appearing and in no acute distress. Eyes: Conjunctivae are normal. PERRL. EOMI. No discharge. Head: Atraumatic. ENT: No frontal and maxillary sinus tenderness.      Ears: Tympanic membranes pearly gray with good landmarks. No discharge.      Nose: Mild congestion/rhinnorhea.      Mouth/Throat: Mucous membranes are moist. Oropharynx non-erythematous. Tonsils not enlarged. No exudates. Uvula midline. Neck: No stridor.   Hematological/Lymphatic/Immunilogical: No cervical lymphadenopathy. Cardiovascular:  Normal rate, regular rhythm.  Good peripheral circulation. Respiratory: Normal respiratory effort without tachypnea or retractions. Lungs CTAB. Good air entry to the bases with no decreased or absent breath sounds. Gastrointestinal: Bowel sounds 4 quadrants. Soft and nontender to palpation. No guarding or rigidity. No palpable masses. No distention. Musculoskeletal: Full range of motion to all extremities. No gross deformities appreciated. Neurologic:  Normal speech and language. No gross focal neurologic deficits are appreciated.  Skin:  Skin is warm, dry and intact. No rash noted.   ____________________________________________   LABS (all labs ordered are listed, but only abnormal results are displayed)  Labs Reviewed - No data to display ____________________________________________  EKG   ____________________________________________  RADIOLOGY  No results found.  ____________________________________________    PROCEDURES  Procedure(s) performed:    Procedures    Medications - No data to display  ____________________________________________   INITIAL IMPRESSION / ASSESSMENT AND PLAN / ED COURSE  Pertinent labs & imaging results that were available during my care of the patient were reviewed by me and considered in my medical decision making (see chart for details).  Review of the [place] CSRS was performed in accordance of the [place] prior to dispensing any controlled drugs.     Patient's diagnosis is consistent with bronchitis. Vital signs and exam are reassuring. Patient is afebrile. I discussed risks and benefits of doing chest x-ray with patient and we elected to defer at this time. Patient appears well. Patient feels comfortable going home. Patient will be discharged home with prescriptions for azithromycin and prednisone. Patient is to follow up with PCP as needed or otherwise directed. Patient is given ED precautions to return to the ED for any worsening or  new symptoms.     ____________________________________________  FINAL CLINICAL IMPRESSION(S) / ED DIAGNOSES  Final diagnoses:  Bronchitis      NEW MEDICATIONS STARTED DURING THIS VISIT:  Discharge Medication List as of 09/30/2016  3:54 PM    START taking these medications   Details  azithromycin (ZITHROMAX Z-PAK) 250 MG tablet Take 2 tablets (500 mg) on  Day 1,  followed by 1 tablet (250 mg) once daily on Days 2 through 5., Print    predniSONE (STERAPRED UNI-PAK 21 TAB) 10 MG (21) TBPK tablet Take 6 tablets on day 1, take 5 tablets on day 2, take 4 tablets on day 3, take 3 tablets on day 4, take 2 tablets on day 5, take 1 tablet on day 6, Print            This chart was dictated using voice recognition software/Dragon. Despite best efforts to proofread, errors can occur which can change the meaning. Any change was purely unintentional.    Laban Emperor, PA-C 09/30/16 Normal, MD 10/01/16 [phone]

## 2016-09-30 NOTE — ED Triage Notes (Signed)
States cold, cough and nasal congestion since January

## 2016-09-30 NOTE — ED Notes (Signed)
Pt to ed with c/o cough since January intermittently, worse over the last few days.  Pt denies fever, does report sore throat, body aches and congestion. Appears in no resp distress.

## 2016-11-02 IMAGING — US US EXTREM LOW VENOUS*L*
1 series · 13 of 24 positions shown · non-contrast
Comparison: None.

CLINICAL DATA: Left lower extremity pain radiating from the hip to
the knee for the past month. Evaluate for DVT.



[Series 1: us extrem low venous*left* · 0.07mm/px · 13 of 35 slices shown]
[im 1/35]
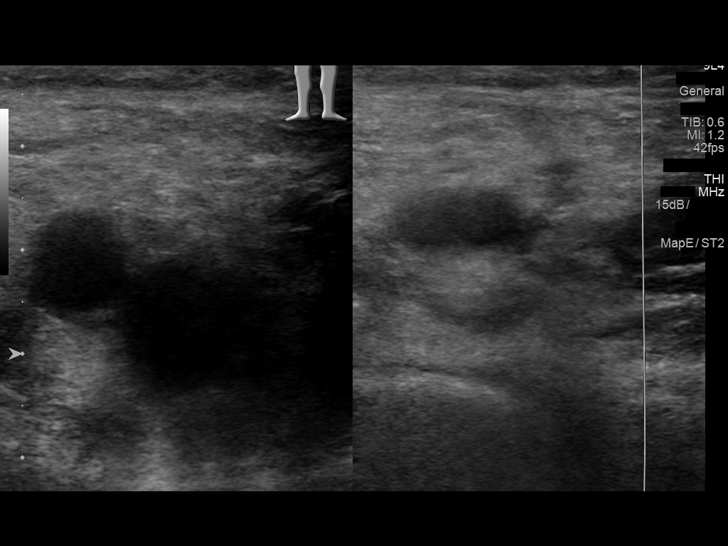
[im 3/35]
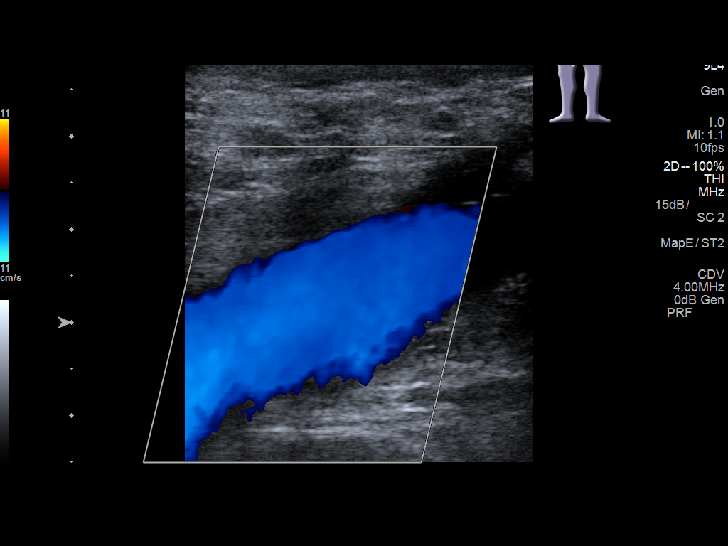
[im 6/35]
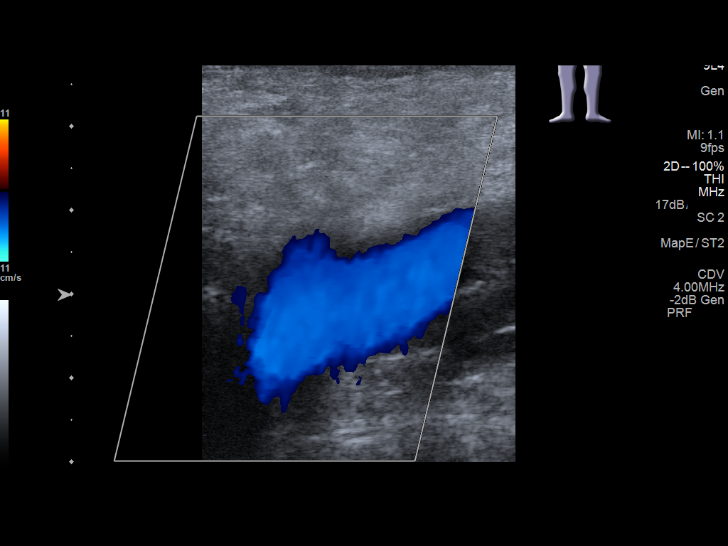
[im 9/35]
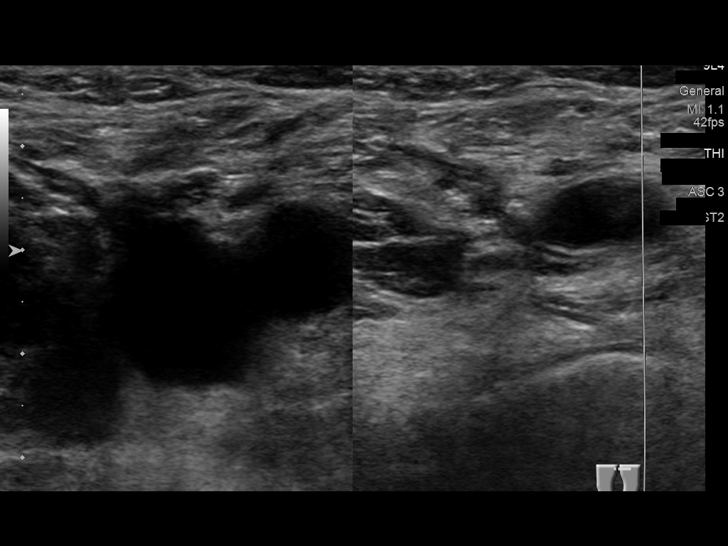
[im 12/35]
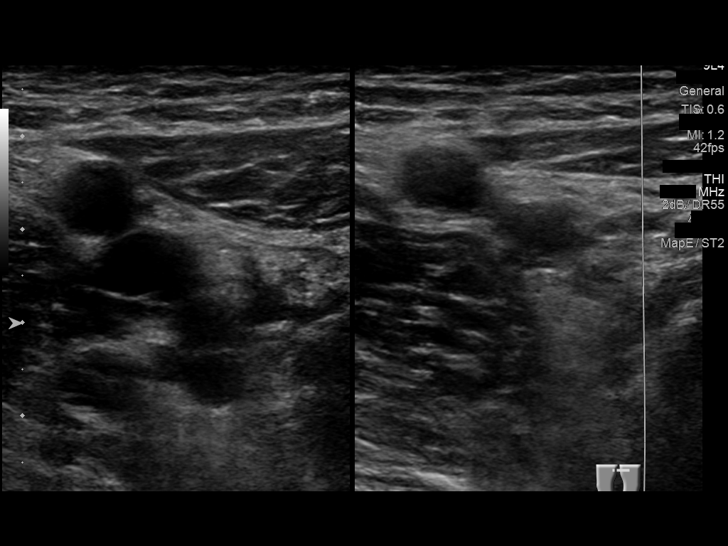
[im 15/35]
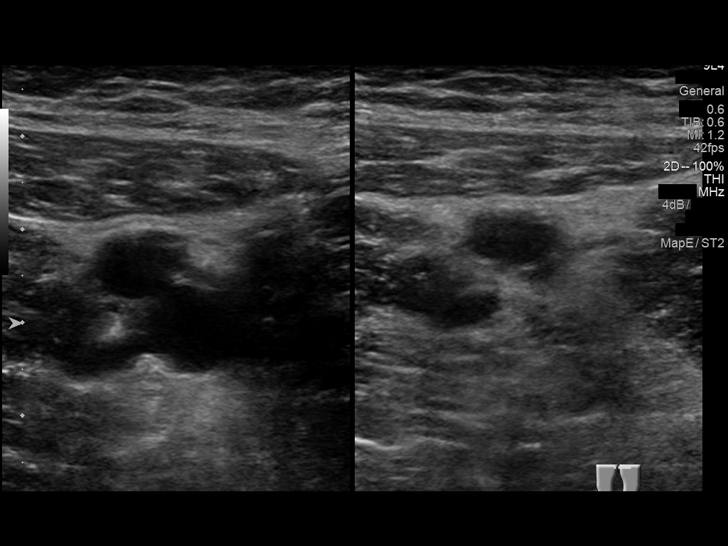
[im 18/35]
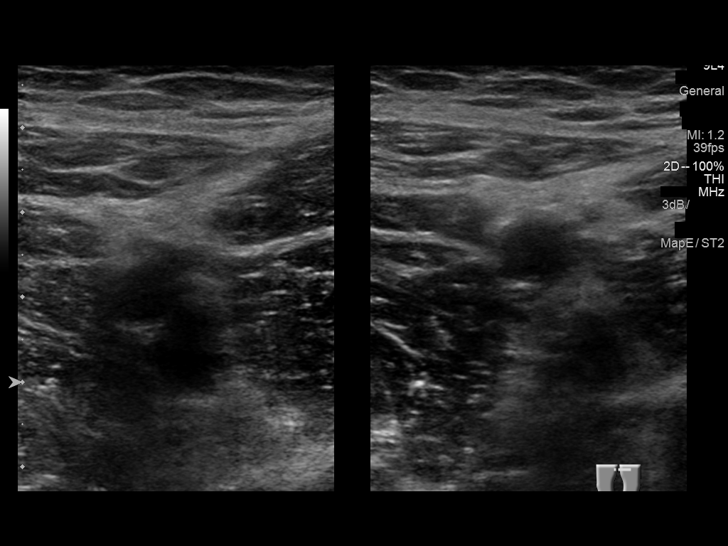
[im 20/35]
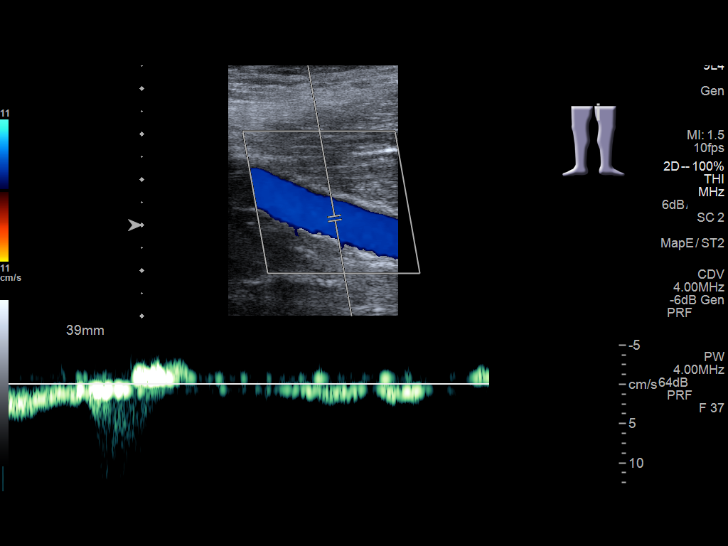
[im 23/35]
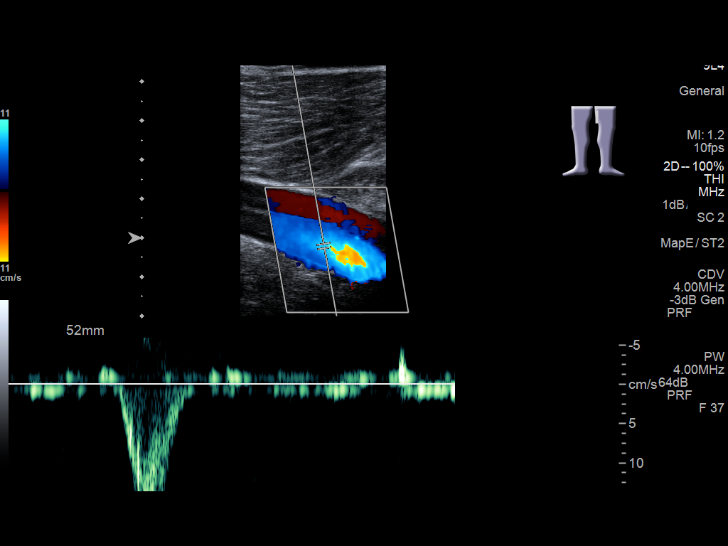
[im 26/35]
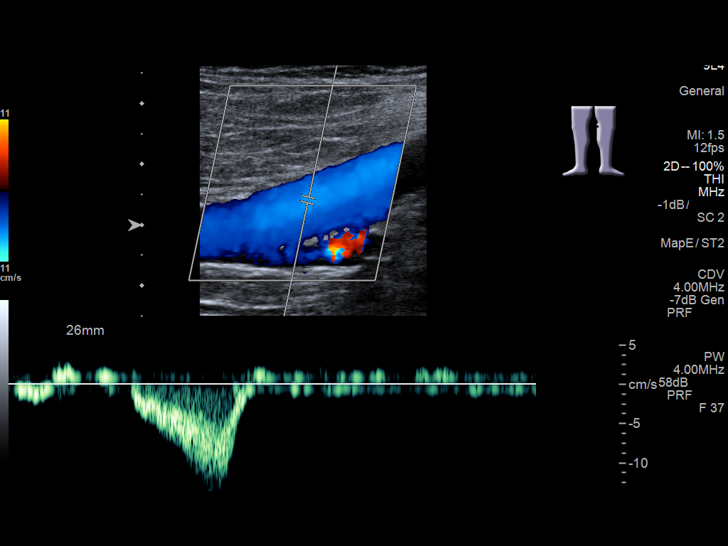
[im 29/35]
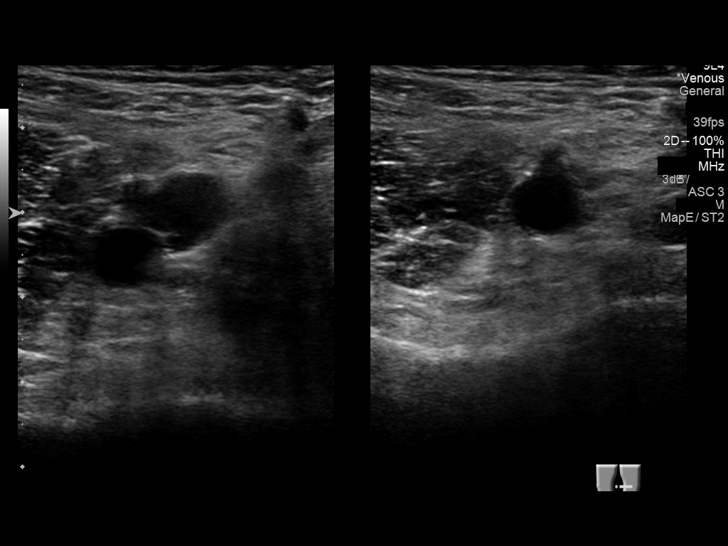
[im 32/35]
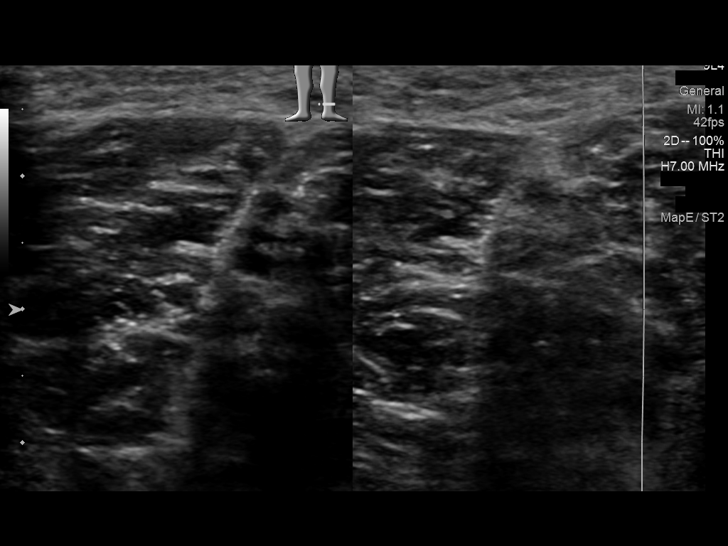
[im 35/35]
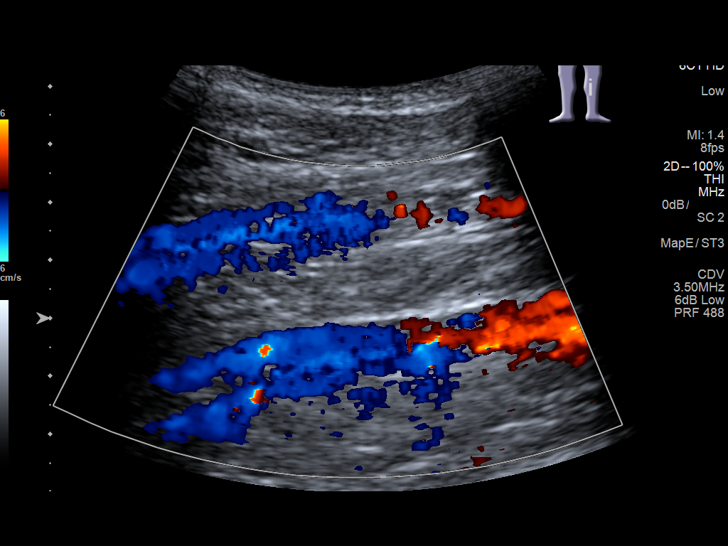

[13 of 24 positions shown; findings below may reference images not displayed]

FINDINGS: Contralateral Common Femoral Vein: Respiratory phasicity is normal
and symmetric with the symptomatic side. No evidence of thrombus.
Normal compressibility.

Common Femoral Vein: No evidence of thrombus. Normal
compressibility, respiratory phasicity and response to augmentation.

Saphenofemoral Junction: No evidence of thrombus. Normal
compressibility and flow on color Doppler imaging.

Profunda Femoral Vein: No evidence of thrombus. Normal
compressibility and flow on color Doppler imaging.

Femoral Vein: No evidence of thrombus. Normal compressibility,
respiratory phasicity and response to augmentation.

Popliteal Vein: No evidence of thrombus. Normal compressibility,
respiratory phasicity and response to augmentation.

Calf Veins: No evidence of thrombus. Normal compressibility and flow
on color Doppler imaging.

Superficial Great Saphenous Vein: No evidence of thrombus. Normal
compressibility and flow on color Doppler imaging.

Venous Reflux:  None.

Other Findings:  None.
IMPRESSION: No evidence of DVT within the left lower extremity.

## 2017-01-02 ENCOUNTER — Emergency Department
Admission: EM | Admit: 2017-01-02 | Discharge: 2017-01-02 | Disposition: A | Discharge status: Home / Self Care | Payer: BLUE CROSS/BLUE SHIELD | Attending: Emergency Medicine | Admitting: Emergency Medicine

## 2017-01-02 ENCOUNTER — Encounter: Payer: Self-pay | Admitting: Emergency Medicine

## 2017-01-02 DIAGNOSIS — R1084 Generalized abdominal pain: Secondary | ICD-10-CM | POA: Diagnosis present

## 2017-01-02 DIAGNOSIS — I10 Essential (primary) hypertension: Secondary | ICD-10-CM | POA: Insufficient documentation

## 2017-01-02 DIAGNOSIS — T675XXA Heat exhaustion, unspecified, initial encounter: Secondary | ICD-10-CM | POA: Diagnosis not present

## 2017-01-02 DIAGNOSIS — Y9389 Activity, other specified: Secondary | ICD-10-CM | POA: Insufficient documentation

## 2017-01-02 DIAGNOSIS — E860 Dehydration: Secondary | ICD-10-CM | POA: Diagnosis not present

## 2017-01-02 DIAGNOSIS — Y929 Unspecified place or not applicable: Secondary | ICD-10-CM | POA: Insufficient documentation

## 2017-01-02 DIAGNOSIS — E119 Type 2 diabetes mellitus without complications: Secondary | ICD-10-CM | POA: Diagnosis not present

## 2017-01-02 DIAGNOSIS — M62838 Other muscle spasm: Secondary | ICD-10-CM | POA: Diagnosis not present

## 2017-01-02 DIAGNOSIS — Y999 Unspecified external cause status: Secondary | ICD-10-CM | POA: Insufficient documentation

## 2017-01-02 DIAGNOSIS — Y33XXXA Other specified events, undetermined intent, initial encounter: Secondary | ICD-10-CM | POA: Diagnosis not present

## 2017-01-02 LAB — URINALYSIS, COMPLETE (UACMP) WITH MICROSCOPIC
Bacteria, UA: NONE SEEN
Bilirubin Urine: NEGATIVE
Glucose, UA: >=500 mg/dL — AB
Ketones, ur: NEGATIVE mg/dL
Leukocytes, UA: NEGATIVE
Nitrite: NEGATIVE
PROTEIN: 100 mg/dL — AB
SQUAMOUS EPITHELIAL / LPF: NONE SEEN
Specific Gravity, Urine: 1.024 (ref 1.005–1.030)
pH: 5 (ref 5.0–8.0)

## 2017-01-02 LAB — CK
Total CK: 535 U/L — ABNORMAL HIGH (ref 49–397)
Total CK: 539 U/L — ABNORMAL HIGH (ref 49–397)

## 2017-01-02 LAB — HEPATIC FUNCTION PANEL
ALT: 25 U/L (ref 17–63)
AST: 32 U/L (ref 15–41)
Albumin: 4.5 g/dL (ref 3.5–5.0)
Alkaline Phosphatase: 70 U/L (ref 38–126)
TOTAL PROTEIN: 8 g/dL (ref 6.5–8.1)
Total Bilirubin: 0.7 mg/dL (ref 0.3–1.2)

## 2017-01-02 LAB — BASIC METABOLIC PANEL
ANION GAP: 13 (ref 5–15)
BUN: 17 mg/dL (ref 6–20)
CHLORIDE: 103 mmol/L (ref 101–111)
CO2: 21 mmol/L — AB (ref 22–32)
Calcium: 9.5 mg/dL (ref 8.9–10.3)
Creatinine, Ser: 1.51 mg/dL — ABNORMAL HIGH (ref 0.61–1.24)
GFR calc non Af Amer: 55 mL/min — ABNORMAL LOW (ref >60)
Glucose, Bld: 337 mg/dL — ABNORMAL HIGH (ref 65–99)
Potassium: 3.5 mmol/L (ref 3.5–5.1)
Sodium: 137 mmol/L (ref 135–145)

## 2017-01-02 LAB — CBC
HCT: 42.3 % (ref 40.0–52.0)
HEMOGLOBIN: 13.9 g/dL (ref 13.0–18.0)
MCH: 28.4 pg (ref 26.0–34.0)
MCHC: 32.8 g/dL (ref 32.0–36.0)
MCV: 86.4 fL (ref 80.0–100.0)
Platelets: 168 10*3/uL (ref 150–440)
RBC: 4.89 MIL/uL (ref 4.40–5.90)
RDW: 13.8 % (ref 11.5–14.5)
WBC: 6.3 10*3/uL (ref 3.8–10.6)

## 2017-01-02 LAB — GLUCOSE, CAPILLARY: GLUCOSE-CAPILLARY: 334 mg/dL — AB (ref 65–99)

## 2017-01-02 MED ORDER — TRAMADOL HCL 50 MG PO TABS: 50.0000 mg | ORAL_TABLET | Freq: Four times a day (QID) | ORAL | 0 refills | Status: DC | PRN

## 2017-01-02 MED ORDER — DIAZEPAM 5 MG PO TABS: 5.0000 mg | ORAL_TABLET | Freq: Three times a day (TID) | ORAL | 0 refills | Status: DC | PRN

## 2017-01-02 MED ORDER — KETOROLAC TROMETHAMINE 30 MG/ML IJ SOLN
15.0000 mg | Freq: Once | INTRAMUSCULAR | Status: AC
  Administered 2017-01-02: 15 mg via INTRAVENOUS
  Filled 2017-01-02: qty 1

## 2017-01-02 MED ORDER — SODIUM CHLORIDE 0.9 % IV BOLUS (SEPSIS)
1000.0000 mL | Freq: Once | INTRAVENOUS | Status: AC
  Administered 2017-01-02: 1000 mL via INTRAVENOUS

## 2017-01-02 MED ORDER — SODIUM CHLORIDE 0.9 % IV SOLN
Freq: Once | INTRAVENOUS | Status: AC
  Administered 2017-01-02: 17:00:00 via INTRAVENOUS

## 2017-01-02 MED ORDER — LORAZEPAM 2 MG/ML IJ SOLN
1.0000 mg | Freq: Once | INTRAMUSCULAR | Status: AC
Start: 2017-01-02 — End: 2017-01-02
  Administered 2017-01-02: 1 mg via INTRAVENOUS
  Filled 2017-01-02: qty 1

## 2017-01-02 NOTE — ED Provider Notes (Signed)
[facility] Emergency Department Provider Note       Time seen: ----------------------------------------- 5:20 PM on 01/02/2017 -----------------------------------------     I have reviewed the triage vital signs and the nursing notes.   HISTORY   Chief Complaint Abdominal Cramping and Spasms    HPI Nathan Chan is a 46 y.o. male who presents to the ED for cramping in his arms, legs and stomach. Patient reports been moving furniture and has not been drinking a lot of water today and began to have cramps. Patient reports he has been getting cramps approximately twice a week for the past 6 months but states the cramps today are much worse and lasting longer than normal. Patient states she does have diabetes and states his blood sugar runs in the 200s.   Past Medical History:  Diagnosis Date  . Diabetes mellitus without complication ([place])   . Hyperlipidemia   . Hypertension   . Meningitis 2015    Patient Active Problem List   Diagnosis Date Noted  . Rib lesion 02/28/2016  . 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 Erroneous encounter - disregard 02/19/2016  . Diabetes mellitus type I ([place]) 02/13/2016  . 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 Colitis 01/22/2016  . Albuminuria 09/28/2015  . Proliferative diabetic retinopathy ([place]) 09/06/2015  . 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/12/2015  . 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 Essential hypertension 06/08/2015  . Porokeratosis 05/21/2015  . Rotator cuff tendonitis 04/17/2015  . Corn of foot 04/01/2014  . Thumb laceration 04/01/2014  . Meningitis 12/20/2013  . IP (interphalangeal) joint, sprain, hand 06/21/2013  . Closed fracture of middle or proximal phalanx or phalanges of hand 05/16/2013    Past Surgical History:  Procedure Laterality Date  . EYE SURGERY Right 09/2015    Allergies Patient has no known allergies.  Social History Social History  Substance Use Topics  . Smoking  status: Never Smoker  . Smokeless tobacco: Never Used  . Alcohol use 0.0 oz/week     Comment: Rarely    Review of Systems Constitutional: Negative for fever. Eyes: Negative for vision changes ENT:  Negative for congestion, sore throat Cardiovascular: Negative for chest pain. Respiratory: Negative for shortness of breath. Gastrointestinal: Negative for abdominal pain, vomiting and diarrhea. Genitourinary: Negative for dysuria. Musculoskeletal: Negative for back pain.Positive for diffuse muscle cramps Skin: Negative for rash. Neurological: Negative for headaches, positive for generalized weakness  All systems negative/normal/unremarkable except as stated in the HPI  ____________________________________________   PHYSICAL EXAM:  VITAL SIGNS: ED Triage Vitals  Enc Vitals Group     BP 01/02/17 1445 (!) 157/83     Pulse Rate 01/02/17 1445 96     Resp 01/02/17 1445 18     Temp 01/02/17 1445 98.5 F (36.9 C)     Temp Source 01/02/17 1445 Oral     SpO2 01/02/17 1445 100 %     Weight 01/02/17 1446 210 lb (95.3 kg)     Height 01/02/17 1446 6' (1.829 m)     Head Circumference --      Peak Flow --      Pain Score 01/02/17 1444 7     Pain Loc --      Pain Edu? --      Excl. in [place]? --     Constitutional: Alert and oriented. Well appearing and in no distress. Eyes: Conjunctivae are normal. Normal extraocular movements. ENT   Head: Normocephalic and atraumatic.   Nose: No congestion/rhinnorhea.  Mouth/Throat: Mucous membranes are moist.   Neck: No stridor. Cardiovascular: Normal rate, regular rhythm. No murmurs, rubs, or gallops. Respiratory: Normal respiratory effort without tachypnea nor retractions. Breath sounds are clear and equal bilaterally. No wheezes/rales/rhonchi. Gastrointestinal: Soft and nontender. Normal bowel sounds Musculoskeletal: Nontender with normal range of motion in extremities. No lower extremity tenderness nor edema. Neurologic:  Normal  speech and language. No gross focal neurologic deficits are appreciated.  Skin:  Skin is warm, dry and intact. No rash noted. Psychiatric: Mood and affect are normal. Speech and behavior are normal.  ____________________________________________  ED COURSE:  Pertinent labs & imaging results that were available during my care of the patient were reviewed by me and considered in my medical decision making (see chart for details). Patient presents for Myalgias and possible heat-related illness, we will assess with labs and imaging as indicated.   Procedures ____________________________________________   LABS (pertinent positives/negatives)  Labs Reviewed  BASIC METABOLIC PANEL - Abnormal; Notable for the following:       Result Value   CO2 21 (*)    Glucose, Bld 337 (*)    Creatinine, Ser 1.51 (*)    GFR calc non Af Amer 55 (*)    All other components within normal limits  URINALYSIS, COMPLETE (UACMP) WITH MICROSCOPIC - Abnormal; Notable for the following:    Color, Urine STRAW (*)    APPearance CLEAR (*)    Glucose, UA >=500 (*)    Hgb urine dipstick MODERATE (*)    Protein, ur 100 (*)    All other components within normal limits  GLUCOSE, CAPILLARY - Abnormal; Notable for the following:    Glucose-Capillary 334 (*)    All other components within normal limits  CK - Abnormal; Notable for the following:    Total CK 535 (*)    All other components within normal limits  CK - Abnormal; Notable for the following:    Total CK 539 (*)    All other components within normal limits  HEPATIC FUNCTION PANEL - Abnormal; Notable for the following:    Bilirubin, Direct <0.1 (*)    All other components within normal limits  CBC  CBG MONITORING, ED   ____________________________________________  FINAL ASSESSMENT AND PLAN  Heat exhaustion, dehydration, hyperglycemia, Mildly elevated CK level  Plan: Patient's labs were dictated above. Patient had presented for heat related illness in  mildly elevated CK levels. He did appear dehydrated and he was given 2 L of fluid with some improvement in his symptoms. He has described these symptoms on and off for a while and I think this is affected by his diabetes and hyperglycemia. I will refer him to rheumatology for outpatient follow-up.   Earleen Newport, MD   Note: This note was generated in part or whole with voice recognition software. Voice recognition is usually quite accurate but there are transcription errors that can and very often do occur. I apologize for any typographical errors that were not detected and corrected.     Earleen Newport, MD 01/02/17 [phone]

## 2017-01-02 NOTE — ED Triage Notes (Signed)
Patient presents to the ED with cramping in his arms, legs and stomach.  Patient reports he has been moving furniture and has not been drinking a lot of water today and began to have cramps.  Patient reports he has been getting cramps approx. 2 x per week for the past 6 months but patient reports these cramps today are much worse and longer lasting than normal.  Patient states he has diabetes and normally drinks, "a case of water/week."  Patient appears uncomfortable in triage.  Patient came to the ED via EMS and EMS started an IV and gave patient approx. 368ml of fluid.

## 2017-01-14 ENCOUNTER — Ambulatory Visit (INDEPENDENT_AMBULATORY_CARE_PROVIDER_SITE_OTHER): Payer: BLUE CROSS/BLUE SHIELD | Admitting: Podiatry

## 2017-01-14 ENCOUNTER — Encounter: Payer: Self-pay | Admitting: Podiatry

## 2017-01-14 DIAGNOSIS — B070 Plantar wart: Secondary | ICD-10-CM

## 2017-01-14 DIAGNOSIS — Q828 Other specified congenital malformations of skin: Secondary | ICD-10-CM

## 2017-01-14 NOTE — Progress Notes (Signed)
   Subjective: 46 year old male presents the office today for evaluation of a symptomatic painful skin lesion noted to the plantar aspect of his left forefoot. This been going on for several years now. He walks with an antalgic gait due to the pain in severity of the lesion to the left plantar forefoot. Patient presents today for further treatment and evaluation.  Objective: Physical Exam General: The patient is alert and oriented x3 in no acute distress.  Dermatology: Hyperkeratotic skin lesion noted to the plantar aspect of the left foot approximately 1 cm in diameter. Skin is warm, dry and supple bilateral lower extremities. Negative for open lesions or macerations.  Vascular: Palpable pedal pulses bilaterally. No edema or erythema noted. Capillary refill within normal limits.  Neurological: Epicritic and protective threshold grossly intact bilaterally.   Musculoskeletal Exam: Pain on palpation to the noted skin lesion.  Range of motion within normal limits to all pedal and ankle joints bilateral. Muscle strength 5/5 in all groups bilateral.   Assessment: #1 plantar wart left foot #2 pain in left foot   Plan of Care:  #1 Patient was evaluated. #2 Excisional debridement of the plantar wart lesion was performed using a chisel blade. Cantharone was applied and the lesion was dressed with a dry sterile dressing. #3 patient is to return to clinic in 2 weeks for follow-up evaluation.   Edrick Kins, DPM [facility]  Dr. Edrick Kins, [address]                                        [place], [place] [address]                Office [phone]  Fax [phone]

## 2017-01-15 ENCOUNTER — Telehealth: Payer: Self-pay | Admitting: Podiatry

## 2017-01-15 NOTE — Telephone Encounter (Signed)
I saw Dr. Amalia Hailey yesterday for my foot and now my foot is hurting me. Can someone please call me back.

## 2017-01-16 NOTE — Telephone Encounter (Signed)
Returned patient call, left message to call back

## 2017-01-28 ENCOUNTER — Encounter: Payer: Self-pay | Admitting: Podiatry

## 2017-01-28 ENCOUNTER — Ambulatory Visit (INDEPENDENT_AMBULATORY_CARE_PROVIDER_SITE_OTHER): Payer: BLUE CROSS/BLUE SHIELD | Admitting: Podiatry

## 2017-01-28 DIAGNOSIS — B070 Plantar wart: Secondary | ICD-10-CM

## 2017-01-28 NOTE — Progress Notes (Signed)
   Subjective: Patient presents today for follow-up treatment and evaluation of a plantar wart left foot. Patient believes that the wart is still present. He still has some tenderness to the area of the left foot. Patient presents today for further treatment and evaluation  Objective: Physical Exam General: The patient is alert and oriented x3 in no acute distress.  Dermatology: Hyperkeratotic skin lesion noted to the plantar aspect of the left foot approximately 1 cm in diameter. Skin is warm, dry and supple bilateral lower extremities. Negative for open lesions or macerations.  Vascular: Palpable pedal pulses bilaterally. No edema or erythema noted. Capillary refill within normal limits.  Neurological: Epicritic and protective threshold grossly intact bilaterally.   Musculoskeletal Exam: Pain on palpation to the noted skin lesion.  Range of motion within normal limits to all pedal and ankle joints bilateral. Muscle strength 5/5 in all groups bilateral.   Assessment: #1 plantar wart left foot #2 pain in left foot   Plan of Care:  #1 Patient was evaluated. #2 Excisional debridement of the plantar wart lesion was performed using a chisel blade. Cantharone was again applied and the lesion was dressed with a dry sterile dressing. #3 patient is to return to clinic in 2 weeks for follow-up evaluation.   Edrick Kins, DPM [facility]  Dr. Edrick Kins, [address]                                        [place], [place] [address]                Office [phone]  Fax [phone]

## 2017-02-11 ENCOUNTER — Ambulatory Visit (INDEPENDENT_AMBULATORY_CARE_PROVIDER_SITE_OTHER): Payer: BLUE CROSS/BLUE SHIELD | Admitting: Podiatry

## 2017-02-11 ENCOUNTER — Encounter: Payer: Self-pay | Admitting: Podiatry

## 2017-02-11 DIAGNOSIS — B070 Plantar wart: Secondary | ICD-10-CM | POA: Diagnosis not present

## 2017-02-15 NOTE — Progress Notes (Signed)
   Subjective: Patient presents today for follow-up treatment and evaluation regarding a wart to the left plantar foot. he is doing much better. Patient denies pain today.  Objective: Physical Exam General: The patient is alert and oriented x3 in no acute distress.  Dermatology: Hyperkeratotic skin lesion noted to the plantar aspect of the left foot approximately 1 cm in diameter. Skin is warm, dry and supple bilateral lower extremities. Negative for open lesions or macerations.   Vascular: Palpable pedal pulses bilaterally. No edema or erythema noted. Capillary refill within normal limits.  Neurological: Epicritic and protective threshold grossly intact bilaterally.   Musculoskeletal Exam: Pain on palpation to the noted skin lesion.  Range of motion within normal limits to all pedal and ankle joints bilateral. Muscle strength 5/5 in all groups bilateral.   Assessment: #1 plantar wart left foot #2 pain in left foot   Plan of Care:  #1 Patient was evaluated. #2 Excisional debridement of the plantar wart lesion was performed using a chisel blade. Chemical application of salicylic acid cream applied to the wart lesion. #3 recommend that the patient applied Dr. Felicie Morn wart remover for any re-residual remaining wart #4 return to clinic when necessary   Edrick Kins, DPM [facility]  Dr. Edrick Kins, [address]                                        [place], [place] [address]                Office [phone]  Fax [phone]

## 2017-04-03 ENCOUNTER — Emergency Department: Payer: BLUE CROSS/BLUE SHIELD

## 2017-04-03 ENCOUNTER — Emergency Department
Admission: EM | Admit: 2017-04-03 | Discharge: 2017-04-03 | Disposition: A | Discharge status: Home / Self Care | Payer: BLUE CROSS/BLUE SHIELD | Attending: Student in an Organized Health Care Education/Training Program | Admitting: Student in an Organized Health Care Education/Training Program

## 2017-04-03 ENCOUNTER — Encounter: Payer: Self-pay | Admitting: Emergency Medicine

## 2017-04-03 DIAGNOSIS — Z79899 Other long term (current) drug therapy: Secondary | ICD-10-CM | POA: Diagnosis not present

## 2017-04-03 DIAGNOSIS — R1011 Right upper quadrant pain: Secondary | ICD-10-CM | POA: Insufficient documentation

## 2017-04-03 DIAGNOSIS — G8929 Other chronic pain: Secondary | ICD-10-CM | POA: Insufficient documentation

## 2017-04-03 DIAGNOSIS — Z791 Long term (current) use of non-steroidal anti-inflammatories (NSAID): Secondary | ICD-10-CM | POA: Insufficient documentation

## 2017-04-03 DIAGNOSIS — Z794 Long term (current) use of insulin: Secondary | ICD-10-CM | POA: Insufficient documentation

## 2017-04-03 DIAGNOSIS — I10 Essential (primary) hypertension: Secondary | ICD-10-CM | POA: Insufficient documentation

## 2017-04-03 DIAGNOSIS — E109 Type 1 diabetes mellitus without complications: Secondary | ICD-10-CM | POA: Insufficient documentation

## 2017-04-03 DIAGNOSIS — Z7982 Long term (current) use of aspirin: Secondary | ICD-10-CM | POA: Insufficient documentation

## 2017-04-03 DIAGNOSIS — M25511 Pain in right shoulder: Secondary | ICD-10-CM | POA: Insufficient documentation

## 2017-04-03 DIAGNOSIS — R109 Unspecified abdominal pain: Secondary | ICD-10-CM

## 2017-04-03 LAB — CBC
HEMATOCRIT: 42.4 % (ref 40.0–52.0)
HEMOGLOBIN: 13.9 g/dL (ref 13.0–18.0)
MCH: 27.8 pg (ref 26.0–34.0)
MCHC: 32.9 g/dL (ref 32.0–36.0)
MCV: 84.4 fL (ref 80.0–100.0)
Platelets: 186 10*3/uL (ref 150–440)
RBC: 5.02 MIL/uL (ref 4.40–5.90)
RDW: 14.1 % (ref 11.5–14.5)
WBC: 3.8 10*3/uL (ref 3.8–10.6)

## 2017-04-03 LAB — BASIC METABOLIC PANEL
Anion gap: 10 (ref 5–15)
BUN: 20 mg/dL (ref 6–20)
CALCIUM: 8.9 mg/dL (ref 8.9–10.3)
CO2: 25 mmol/L (ref 22–32)
Chloride: 100 mmol/L — ABNORMAL LOW (ref 101–111)
Creatinine, Ser: 1.29 mg/dL — ABNORMAL HIGH (ref 0.61–1.24)
GLUCOSE: 280 mg/dL — AB (ref 65–99)
Potassium: 4.4 mmol/L (ref 3.5–5.1)
SODIUM: 135 mmol/L (ref 135–145)

## 2017-04-03 LAB — URINALYSIS, COMPLETE (UACMP) WITH MICROSCOPIC
Bacteria, UA: NONE SEEN
Bilirubin Urine: NEGATIVE
Glucose, UA: 500 mg/dL — AB
Leukocytes, UA: NEGATIVE
NITRITE: NEGATIVE
PROTEIN: 100 mg/dL — AB
Specific Gravity, Urine: 1.02 (ref 1.005–1.030)
WBC, UA: NONE SEEN WBC/hpf (ref 0–5)
pH: 5.5 (ref 5.0–8.0)

## 2017-04-03 MED ORDER — LIDOCAINE 5 % EX PTCH
1.0000 | MEDICATED_PATCH | CUTANEOUS | Status: DC
  Administered 2017-04-03: 1 via TRANSDERMAL
  Filled 2017-04-03: qty 1

## 2017-04-03 MED ORDER — INSULIN ASPART 100 UNIT/ML ~~LOC~~ SOLN
5.0000 [IU] | Freq: Once | SUBCUTANEOUS | Status: AC
  Administered 2017-04-03: 5 [IU] via SUBCUTANEOUS
  Filled 2017-04-03: qty 1

## 2017-04-03 MED ORDER — POLYETHYLENE GLYCOL 3350 17 G PO PACK: 17.0000 g | PACK | Freq: Every day | ORAL | 0 refills | Status: AC

## 2017-04-03 MED ORDER — CYCLOBENZAPRINE HCL 5 MG PO TABS: 5.0000 mg | ORAL_TABLET | Freq: Every day | ORAL | 0 refills | Status: DC

## 2017-04-03 MED ORDER — TRAMADOL HCL 50 MG PO TABS: 50.0000 mg | ORAL_TABLET | Freq: Four times a day (QID) | ORAL | 0 refills | Status: DC | PRN

## 2017-04-03 NOTE — ED Triage Notes (Signed)
Pt reports bilateral shoulder pain for one month. Denies injury. Pt reports right side flank pain for one month as well. Denies fever. Denies NVD. Denies abdominal pain. Denies dysuria.

## 2017-04-03 NOTE — ED Notes (Signed)
ED Provider at bedside. 

## 2017-04-03 NOTE — ED Notes (Signed)
Bilateral shoulder pain, worse with movement X "months", right lower back pain with activity. Pt alert and oriented X4, active, cooperative, pt in NAD. RR even and unlabored, color WNL. Pt lifts furniture for work.

## 2017-04-03 NOTE — ED Provider Notes (Signed)
[facility] Emergency Department Provider Note    First MD Initiated Contact with Patient 04/03/17 1419     (approximate)  I have reviewed the triage vital signs and the nursing notes.   HISTORY  Chief Complaint Shoulder Pain and Flank Pain    HPI Nathan Chan is a 46 y.o. male history of diabetes and hypercholesterolemia as well as hypertension presents with chief complaint of right-sided flank pain and right sided shoulder pain for the past month. 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 Denies any numbness or tingling. No shortness of breath. No chest pain. States that the pain is constant and worse with movement. States that he sometimes gets night sweats. Does not smoke. No family history of cancer. Has not tried anything at home to help with the pain. No recent fevers.   Past Medical History:  Diagnosis Date  . Diabetes mellitus without complication ([place])   . Hyperlipidemia   . Hypertension   . Meningitis 2015   Family History  Problem Relation Age of Onset  . Stroke Mother   . Diabetes Mother   . Hypertension Mother   . Stroke Father   . Diabetes Father   . Hypertension Father   . Stroke Sister   . Hypertension Maternal Grandmother   . Hypertension Paternal Grandmother    Past Surgical History:  Procedure Laterality Date  . EYE SURGERY Right 09/2015   Patient Active Problem List   Diagnosis Date Noted  . Rib lesion 02/28/2016  . 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 Erroneous encounter - disregard 02/19/2016  . Diabetes mellitus type I ([place]) 02/13/2016  . 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 Colitis 01/22/2016  . Albuminuria 09/28/2015  . Proliferative diabetic retinopathy ([place]) 09/06/2015  . 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/12/2015  . 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 Essential hypertension 06/08/2015  . Porokeratosis 05/21/2015  . Rotator cuff tendonitis  04/17/2015  . Corn of foot 04/01/2014  . Thumb laceration 04/01/2014  . Meningitis 12/20/2013  . IP (interphalangeal) joint, sprain, hand 06/21/2013  . Closed fracture of middle or proximal phalanx or phalanges of hand 05/16/2013      Prior to Admission medications   Medication Sig Start Date End Date Taking? Authorizing Provider  acetaminophen (TYLENOL) 325 MG tablet Take 650 mg by mouth every 6 (six) hours as needed.    [provider]  amLODipine (NORVASC) 2.5 MG tablet Take 2.5 mg by mouth 1 day or 1 dose. 02/29/16 02/28/17  [provider]  aspirin 81 MG tablet Take 81 mg by mouth daily.    [provider]  azithromycin (ZITHROMAX Z-PAK) 250 MG tablet Take 2 tablets (500 mg) on  Day 1,  followed by 1 tablet (250 mg) once daily on Days 2 through 5. 09/30/16   Laban Emperor, PA-C  brompheniramine-pseudoephedrine-DM 30-2-10 MG/5ML syrup Take 5 mLs by mouth 4 (four) times daily as needed. 08/02/16   Sable Feil, PA-C  chlorpheniramine-HYDROcodone (TUSSIONEX PENNKINETIC ER) 10-8 MG/5ML SUER Take 5 mLs by mouth 2 (two) times daily. 07/26/16   Earleen Newport, MD  cyclobenzaprine (FLEXERIL) 5 MG tablet Take 1 tablet (5 mg total) by mouth at bedtime. 04/03/17   Merlyn Lot, MD  diazepam (VALIUM) 5 MG tablet Take 1 tablet (5 mg total) by mouth every 8 (eight) hours as needed for muscle spasms. 01/02/17   Earleen Newport, MD  ibuprofen (ADVIL,MOTRIN) 600 MG tablet  Take 1 tablet (600 mg total) by mouth every 8 (eight) hours as needed. 08/02/16   Sable Feil, PA-C  insulin aspart protamine - aspart (NOVOLOG 70/30 MIX) (70-30) 100 UNIT/ML FlexPen 25units BID 01/05/15   [provider]  insulin glargine (LANTUS) 100 UNIT/ML injection Inject 20 Units into the skin at bedtime.    [provider]  lisinopril (PRINIVIL,ZESTRIL) 20 MG tablet Take 20 mg by mouth daily.    [provider]  naproxen (NAPROSYN) 500 MG tablet Take 1 tablet  (500 mg total) by mouth 2 (two) times daily with a meal. 04/08/16   Hagler, Jami L, PA-C  ondansetron (ZOFRAN ODT) 4 MG disintegrating tablet Take 1 tablet (4 mg total) by mouth every 8 (eight) hours as needed for nausea or vomiting. 07/26/16   Earleen Newport, MD  polyethylene glycol (MIRALAX / Floria Raveling) packet Take 17 g by mouth daily. Mix one tablespoon with 8oz of your favorite juice or water every day until you are having soft formed stools. Then start taking once daily if you didn't have a stool the day before. 04/03/17   Merlyn Lot, MD  predniSONE (STERAPRED UNI-PAK 21 TAB) 10 MG (21) TBPK tablet Take 6 tablets on day 1, take 5 tablets on day 2, take 4 tablets on day 3, take 3 tablets on day 4, take 2 tablets on day 5, take 1 tablet on day 6 09/30/16   Laban Emperor, PA-C  traMADol (ULTRAM) 50 MG tablet Take 1 tablet by mouth 1 day or 1 dose. 02/01/16   [provider]  traMADol (ULTRAM) 50 MG tablet Take 1 tablet (50 mg total) by mouth every 6 (six) hours as needed. 01/02/17 01/02/18  Earleen Newport, MD  traMADol (ULTRAM) 50 MG tablet Take 1 tablet (50 mg total) by mouth every 6 (six) hours as needed. 04/03/17 04/03/18  Merlyn Lot, MD    Allergies Patient has no known allergies.    Social History Social History  Substance Use Topics  . Smoking status: Never Smoker  . Smokeless tobacco: Never Used  . Alcohol use 0.0 oz/week     Comment: Rarely    Review of Systems Patient denies headaches, rhinorrhea, blurry vision, numbness, shortness of breath, chest pain, edema, cough, abdominal pain, nausea, vomiting, diarrhea, dysuria, fevers, rashes or hallucinations unless otherwise stated above in HPI. ____________________________________________   PHYSICAL EXAM:  VITAL SIGNS: Vitals:   04/03/17 1343  BP: (!) 144/84  Pulse: 92  Resp: 17  Temp: 98.2 F (36.8 C)  SpO2: 97%    Constitutional: Alert and oriented. Well appearing and in no acute  distress. Eyes: Conjunctivae are normal.  Head: Atraumatic. Nose: No congestion/rhinnorhea. Mouth/Throat: Mucous membranes are moist.   Neck: No stridor. Painless ROM.  Cardiovascular: Normal rate, regular rhythm. Grossly normal heart sounds.  Good peripheral circulation. Respiratory: Normal respiratory effort.  No retractions. Lungs CTAB. Gastrointestinal: Soft and nontender. No distention. No abdominal bruits. No CVA tenderness. Musculoskeletal:  tenderness to palpation along the posterior right thoracic wall. No crepitus. No ecchymosis. No lower extremity tenderness nor edema.  No joint effusions. Neurologic:  Normal speech and language. No gross focal neurologic deficits are appreciated. No facial droop Skin:  Skin is warm, dry and intact. No rash noted. Psychiatric: Mood and affect are normal. Speech and behavior are normal.  ____________________________________________   LABS (all labs ordered are listed, but only abnormal results are displayed)  Results for orders placed or performed during the hospital encounter of 04/03/17 (  from the past 24 hour(s))  Urinalysis, Complete w Microscopic     Status: Abnormal   Collection Time: 04/03/17  1:45 PM  Result Value Ref Range   Color, Urine YELLOW YELLOW   APPearance CLEAR CLEAR   Specific Gravity, Urine 1.020 1.005 - 1.030   pH 5.5 5.0 - 8.0   Glucose, UA 500 (A) NEGATIVE mg/dL   Hgb urine dipstick SMALL (A) NEGATIVE   Bilirubin Urine NEGATIVE NEGATIVE   Ketones, ur TRACE (A) NEGATIVE mg/dL   Protein, ur 100 (A) NEGATIVE mg/dL   Nitrite NEGATIVE NEGATIVE   Leukocytes, UA NEGATIVE NEGATIVE   Squamous Epithelial / LPF 0-5 (A) NONE SEEN   WBC, UA NONE SEEN 0 - 5 WBC/hpf   RBC / HPF 0-5 0 - 5 RBC/hpf   Bacteria, UA NONE SEEN NONE SEEN  Basic metabolic panel     Status: Abnormal   Collection Time: 04/03/17  1:46 PM  Result Value Ref Range   Sodium 135 135 - 145 mmol/L   Potassium 4.4 3.5 - 5.1 mmol/L   Chloride 100 (L) 101 - 111  mmol/L   CO2 25 22 - 32 mmol/L   Glucose, Bld 280 (H) 65 - 99 mg/dL   BUN 20 6 - 20 mg/dL   Creatinine, Ser 1.29 (H) 0.61 - 1.24 mg/dL   Calcium 8.9 8.9 - 10.3 mg/dL   GFR calc non Af Amer >60 >60 mL/min   GFR calc Af Amer >60 >60 mL/min   Anion gap 10 5 - 15  CBC     Status: None   Collection Time: 04/03/17  1:46 PM  Result Value Ref Range   WBC 3.8 3.8 - 10.6 K/uL   RBC 5.02 4.40 - 5.90 MIL/uL   Hemoglobin 13.9 13.0 - 18.0 g/dL   HCT 42.4 40.0 - 52.0 %   MCV 84.4 80.0 - 100.0 fL   MCH 27.8 26.0 - 34.0 pg   MCHC 32.9 32.0 - 36.0 g/dL   RDW 14.1 11.5 - 14.5 %   Platelets 186 150 - 440 K/uL   ____________________________________________  EKG My review and personal interpretation at Time: 14:14   Indication: back   Rate: 85  Rhythm: sinus Axis: normal Other: normalintervals, no stemi, non specific st changes ____________________________________________  RADIOLOGY  I personally reviewed all radiographic images ordered to evaluate for the above acute complaints and reviewed radiology reports and findings.  These findings were personally discussed with the patient.  Please see medical record for radiology report. ____________________________________________   PROCEDURES  Procedure(s) performed:  Procedures    Critical Care performed: no ____________________________________________   INITIAL IMPRESSION / ASSESSMENT AND PLAN / ED COURSE  Pertinent labs & imaging results that were available during my care of the patient were reviewed by me and considered in my medical decision making (see chart for details).  DDX: fracture, contusion, ptx, acs, copd, asthma, pna  Nathan Chan is a 46 y.o. who presents to the ED with primarily muscle skeletal pain as described above.  EKG is normal. No evidence of ACS. Chest x-ray and abdominal x-ray ordered for the above differential shows no acute pathology. Does have mild stool burden and there does appear to be expansile bone island  on the right ninth rib which is unchanged has been evaluated by oncology in the past. May be source of pain but does not appear to be an emergent process at this time. Is not clinically consistent with PE as the patient is low risk  by well's and is PERC negative. Patient is stable for further workup as an outpatient.      ____________________________________________   FINAL CLINICAL IMPRESSION(S) / ED DIAGNOSES  Final diagnoses:  Right flank pain  Chronic right shoulder pain      NEW MEDICATIONS STARTED DURING THIS VISIT:  New Prescriptions   CYCLOBENZAPRINE (FLEXERIL) 5 MG TABLET    Take 1 tablet (5 mg total) by mouth at bedtime.   POLYETHYLENE GLYCOL (MIRALAX / GLYCOLAX) PACKET    Take 17 g by mouth daily. Mix one tablespoon with 8oz of your favorite juice or water every day until you are having soft formed stools. Then start taking once daily if you didn't have a stool the day before.   TRAMADOL (ULTRAM) 50 MG TABLET    Take 1 tablet (50 mg total) by mouth every 6 (six) hours as needed.     Note:  This document was prepared using Dragon voice recognition software and may include unintentional dictation errors.    Merlyn Lot, MD 04/03/17 [phone]

## 2017-04-03 NOTE — ED Notes (Signed)
Pt alert and oriented X4, active, cooperative, pt in NAD. RR even and unlabored, color WNL.  Pt informed to return if any life threatening symptoms occur.  Discharge and followup instructions reviewed.  

## 2017-04-03 NOTE — ED Notes (Signed)
Awaiting lidocaine patch.

## 2017-04-03 NOTE — ED Notes (Signed)
Patient transported to X-ray 

## 2017-09-03 ENCOUNTER — Other Ambulatory Visit: Payer: Self-pay

## 2017-09-10 ENCOUNTER — Encounter
Admission: RE | Disposition: A | Discharge status: Home / Self Care | Payer: Self-pay | Source: Ambulatory Visit | Attending: Surgery

## 2017-09-10 ENCOUNTER — Ambulatory Visit: Payer: BLUE CROSS/BLUE SHIELD | Admitting: Anesthesiology

## 2017-09-10 ENCOUNTER — Ambulatory Visit
Admission: RE | Admit: 2017-09-10 | Discharge: 2017-09-10 | Disposition: A | Discharge status: Home / Self Care | Payer: BLUE CROSS/BLUE SHIELD | Source: Ambulatory Visit | Attending: Surgery | Admitting: Surgery

## 2017-09-10 DIAGNOSIS — Z7982 Long term (current) use of aspirin: Secondary | ICD-10-CM | POA: Diagnosis not present

## 2017-09-10 DIAGNOSIS — Z79899 Other long term (current) drug therapy: Secondary | ICD-10-CM | POA: Diagnosis not present

## 2017-09-10 DIAGNOSIS — Z7951 Long term (current) use of inhaled steroids: Secondary | ICD-10-CM | POA: Insufficient documentation

## 2017-09-10 DIAGNOSIS — M7501 Adhesive capsulitis of right shoulder: Secondary | ICD-10-CM | POA: Diagnosis present

## 2017-09-10 DIAGNOSIS — E785 Hyperlipidemia, unspecified: Secondary | ICD-10-CM | POA: Insufficient documentation

## 2017-09-10 DIAGNOSIS — Z794 Long term (current) use of insulin: Secondary | ICD-10-CM | POA: Insufficient documentation

## 2017-09-10 DIAGNOSIS — M7502 Adhesive capsulitis of left shoulder: Secondary | ICD-10-CM | POA: Insufficient documentation

## 2017-09-10 DIAGNOSIS — I10 Essential (primary) hypertension: Secondary | ICD-10-CM | POA: Insufficient documentation

## 2017-09-10 DIAGNOSIS — Z791 Long term (current) use of non-steroidal anti-inflammatories (NSAID): Secondary | ICD-10-CM | POA: Insufficient documentation

## 2017-09-10 DIAGNOSIS — E119 Type 2 diabetes mellitus without complications: Secondary | ICD-10-CM | POA: Diagnosis not present

## 2017-09-10 HISTORY — DX: Pain in right shoulder: M25.511

## 2017-09-10 HISTORY — PX: SHOULDER CLOSED REDUCTION: SHX1051

## 2017-09-10 HISTORY — DX: Pain in left shoulder: M25.512

## 2017-09-10 LAB — GLUCOSE, CAPILLARY
GLUCOSE-CAPILLARY: 251 mg/dL — AB (ref 65–99)
Glucose-Capillary: 144 mg/dL — ABNORMAL HIGH (ref 65–99)

## 2017-09-10 SURGERY — MANIPULATION, JOINT, SHOULDER, WITH ANESTHESIA
Anesthesia: General | Laterality: Bilateral | Wound class: Clean

## 2017-09-10 MED ORDER — ONDANSETRON HCL 4 MG/2ML IJ SOLN
INTRAMUSCULAR | Status: DC | PRN
  Administered 2017-09-10: 4 mg via INTRAVENOUS

## 2017-09-10 MED ORDER — FENTANYL CITRATE (PF) 100 MCG/2ML IJ SOLN
INTRAMUSCULAR | Status: DC | PRN
  Administered 2017-09-10: 100 ug via INTRAVENOUS

## 2017-09-10 MED ORDER — BUPIVACAINE-EPINEPHRINE (PF) 0.25% -1:200000 IJ SOLN
INTRAMUSCULAR | Status: DC | PRN
  Administered 2017-09-10: 18 mL

## 2017-09-10 MED ORDER — OXYCODONE HCL 5 MG PO CAPS: 5.0000 mg | ORAL_CAPSULE | ORAL | 0 refills | Status: AC | PRN

## 2017-09-10 MED ORDER — LACTATED RINGERS IV SOLN
INTRAVENOUS | Status: DC
  Administered 2017-09-10: 12:00:00 via INTRAVENOUS

## 2017-09-10 MED ORDER — METOCLOPRAMIDE HCL 5 MG/ML IJ SOLN: 5.0000 mg | Freq: Three times a day (TID) | INTRAMUSCULAR | Status: DC | PRN

## 2017-09-10 MED ORDER — POTASSIUM CHLORIDE IN NACL 20-0.9 MEQ/L-% IV SOLN: INTRAVENOUS | Status: DC

## 2017-09-10 MED ORDER — ONDANSETRON HCL 4 MG PO TABS: 4.0000 mg | ORAL_TABLET | Freq: Four times a day (QID) | ORAL | Status: DC | PRN

## 2017-09-10 MED ORDER — PROPOFOL 10 MG/ML IV BOLUS
INTRAVENOUS | Status: DC | PRN
  Administered 2017-09-10: 160 mg via INTRAVENOUS

## 2017-09-10 MED ORDER — METOCLOPRAMIDE HCL 5 MG PO TABS: 5.0000 mg | ORAL_TABLET | Freq: Three times a day (TID) | ORAL | Status: DC | PRN

## 2017-09-10 MED ORDER — LIDOCAINE HCL (CARDIAC) 20 MG/ML IV SOLN
INTRAVENOUS | Status: DC | PRN
  Administered 2017-09-10: 50 mg via INTRATRACHEAL

## 2017-09-10 MED ORDER — OXYCODONE HCL 5 MG PO TABS
5.0000 mg | ORAL_TABLET | Freq: Once | ORAL | Status: AC | PRN
  Administered 2017-09-10: 5 mg via ORAL

## 2017-09-10 MED ORDER — GLYCOPYRROLATE 0.2 MG/ML IJ SOLN
INTRAMUSCULAR | Status: DC | PRN
  Administered 2017-09-10: 0.1 mg via INTRAVENOUS

## 2017-09-10 MED ORDER — DEXAMETHASONE SODIUM PHOSPHATE 4 MG/ML IJ SOLN
INTRAMUSCULAR | Status: DC | PRN
  Administered 2017-09-10: 4 mg via INTRAVENOUS

## 2017-09-10 MED ORDER — MIDAZOLAM HCL 5 MG/5ML IJ SOLN
INTRAMUSCULAR | Status: DC | PRN
  Administered 2017-09-10: 2 mg via INTRAVENOUS

## 2017-09-10 MED ORDER — ONDANSETRON HCL 4 MG/2ML IJ SOLN: 4.0000 mg | Freq: Four times a day (QID) | INTRAMUSCULAR | Status: DC | PRN

## 2017-09-10 MED ORDER — FENTANYL CITRATE (PF) 100 MCG/2ML IJ SOLN: 25.0000 ug | INTRAMUSCULAR | Status: DC | PRN

## 2017-09-10 MED ORDER — ONDANSETRON HCL 4 MG/2ML IJ SOLN: 4.0000 mg | Freq: Once | INTRAMUSCULAR | Status: DC | PRN

## 2017-09-10 MED ORDER — OXYCODONE HCL 5 MG/5ML PO SOLN: 5.0000 mg | Freq: Once | ORAL | Status: AC | PRN

## 2017-09-10 MED ORDER — OXYCODONE HCL 5 MG PO TABS: 5.0000 mg | ORAL_TABLET | ORAL | Status: DC | PRN

## 2017-09-10 MED ORDER — TRIAMCINOLONE ACETONIDE 40 MG/ML IJ SUSP
INTRAMUSCULAR | Status: DC | PRN
  Administered 2017-09-10: 80 mg

## 2017-09-10 SURGICAL SUPPLY — 9 items
BNDG ADH 2 X3.75 FABRIC TAN LF (GAUZE/BANDAGES/DRESSINGS) ×6 IMPLANT
KIT TURNOVER KIT A (KITS) ×3 IMPLANT
NEEDLE HYPO 21X1.5 SAFETY (NEEDLE) ×6 IMPLANT
PAD ALCOHOL SWAB (MISCELLANEOUS) ×6 IMPLANT
SLING ARM LRG DEEP (SOFTGOODS) IMPLANT
SLING ARM M TX990204 (SOFTGOODS) IMPLANT
SLING ARM S TX990203 (SOFTGOODS) IMPLANT
STRAP BODY AND KNEE 60X3 (MISCELLANEOUS) ×3 IMPLANT
SYRINGE 10CC LL (SYRINGE) ×6 IMPLANT

## 2017-09-10 NOTE — H&P (Signed)
Paper H&P to be scanned into permanent record. H&P reviewed and patient re-examined. No changes. 

## 2017-09-10 NOTE — Anesthesia Procedure Notes (Signed)
Procedure Name: LMA Insertion Date/Time: 09/10/2017 12:19 PM Performed by: Mayme Genta, CRNA Pre-anesthesia Checklist: Patient identified, Emergency Drugs available, Suction available, Timeout performed and Patient being monitored Patient Re-evaluated:Patient Re-evaluated prior to induction Oxygen Delivery Method: Circle system utilized Preoxygenation: Pre-oxygenation with 100% oxygen Induction Type: IV induction LMA: LMA inserted LMA Size: 4.0 Number of attempts: 1 Placement Confirmation: positive ETCO2 and breath sounds checked- equal and bilateral Tube secured with: Tape

## 2017-09-10 NOTE — Discharge Instructions (Signed)
General Anesthesia, Adult, Care After These instructions provide you with information about caring for yourself after your procedure. Your health care provider may also give you more specific instructions. Your treatment has been planned according to current medical practices, but problems sometimes occur. Call your health care provider if you have any problems or questions after your procedure. What can I expect after the procedure? After the procedure, it is common to have:  Vomiting.  A sore throat.  Mental slowness.  It is common to feel:  Nauseous.  Cold or shivery.  Sleepy.  Tired.  Sore or achy, even in parts of your body where you did not have surgery.  Follow these instructions at home: For at least 24 hours after the procedure:  Do not: ? Participate in activities where you could fall or become injured. ? Drive. ? Use heavy machinery. ? Drink alcohol. ? Take sleeping pills or medicines that cause drowsiness. ? Make important decisions or sign legal documents. ? Take care of children on your own.  Rest. Eating and drinking  If you vomit, drink water, juice, or soup when you can drink without vomiting.  Drink enough fluid to keep your urine clear or pale yellow.  Make sure you have little or no nausea before eating solid foods.  Follow the diet recommended by your health care provider. General instructions  Have a responsible adult stay with you until you are awake and alert.  Return to your normal activities as told by your health care provider. Ask your health care provider what activities are safe for you.  Take over-the-counter and prescription medicines only as told by your health care provider.  If you smoke, do not smoke without supervision.  Keep all follow-up visits as told by your health care provider. This is important. Contact a health care provider if:  You continue to have nausea or vomiting at home, and medicines are not helpful.  You  cannot drink fluids or start eating again.  You cannot urinate after 8-12 hours.  You develop a skin rash.  You have fever.  You have increasing redness at the site of your procedure. Get help right away if:  You have difficulty breathing.  You have chest pain.  You have unexpected bleeding.  You feel that you are having a life-threatening or urgent problem. This information is not intended to replace advice given to you by your health care provider. Make sure you discuss any questions you have with your health care provider. Document Released: 09/30/2000 Document Revised: 11/27/2015 Document Reviewed: 06/08/2015 Elsevier Interactive Patient Education  2018 [employer].   May shower tonight.  Apply ice frequently to both shoulders. Take naproxen 500 mg BID OR resume Mobic 15 mg daily with meals for 7-10 days, then as necessary. Take oxycodone as prescribed when needed.  May supplement with ES Tylenol if necessary. Keep shoulder slings on tonight as needed for comfort. Start PT tomorrow as scheduled. Follow-up in 10-14 days or as scheduled.

## 2017-09-10 NOTE — Transfer of Care (Signed)
Immediate Anesthesia Transfer of Care Note  Patient: Nathan Chan  Procedure(s) Performed: Julaine Hua MANIPULATION SHOULDER WITH POSSIBLE STEROID INJECTION (Bilateral )  Patient Location: PACU  Anesthesia Type: General  Level of Consciousness: awake, alert  and patient cooperative  Airway and Oxygen Therapy: Patient Spontanous Breathing and Patient connected to supplemental oxygen  Post-op Assessment: Post-op Vital signs reviewed, Patient's Cardiovascular Status Stable, Respiratory Function Stable, Patent Airway and No signs of Nausea or vomiting  Post-op Vital Signs: Reviewed and stable  Complications: No apparent anesthesia complications

## 2017-09-10 NOTE — Op Note (Signed)
09/10/2017  12:25 PM  Patient:   Nathan Chan  Pre-Op Diagnosis:   Primary adhesive capsulitis, both shoulders.  Post-Op Diagnosis:   Same  Procedure:   Manipulation under anesthesia with steroid injection, both shoulders.  Surgeon:   Pascal Lux, MD  Assistant:   None  Anesthesia:   General LMA  Findings:   As above. Prior to manipulation, the right shoulder could be forward flexed to 160 and abducted to 155. At 90 of abduction, the shoulder could be externally rotated to 85 and internally rotated to 60. Following manipulation, the right shoulder could be forward flexed to 170, abducted to 165 and, at 90 of abduction, externally rotated to 95 and internally rotated to 70.   Prior to manipulation, the left shoulder could be forward flexed to 160 and abducted to 155. At 90 of abduction, the shoulder could be externally rotated to 85 and internally rotated to 60. Following manipulation, the left shoulder could be forward flexed to 170, abducted to 165 and, at 90 of abduction, externally rotated to 95 and internally rotated to 70.  Complications:   None  EBL:   0 cc  Fluids:   300 cc crystalloid  TT:   None  Drains:   None  Closure:   None  Brief Clinical Note:   The patient is a 47 year old male with a long-standing history of bilateral shoulder pain with limited range of motion. Despite extensive nonsurgical treatment, the patient continues to have difficulty regaining his bilateral shoulder range of motion. The patient's history and examination are consistent with adhesive capsulitis. The patient presents at this time for a manipulation under anesthesia with steroid injection of both shoulders.  Procedure:   The patient was brought into the operating room and lain in the supine position. After adequate general laryngeal mask was achieved, a timeout was performed to verify the correct surgical site. The right shoulder was gently manipulated in both abduction  and external rotation, as well as adduction and internal rotation. Several gentle palpable and audible pops were heard as the scar tissue released, permitting full range of motion of the shoulder. The glenohumeral joint was injected sterilely using 1 cc of Kenalog-40 and 9 cc of 0.25% Sensorcaine with epinephrine before the patient was placed into a sling.   Next, the left shoulder was gently manipulated in both abduction and external rotation, as well as adduction and internal rotation. Several gentle palpable and audible pops were heard as the scar tissue released, permitting full range of motion of the shoulder. The glenohumeral joint was injected sterilely using 1 cc of Kenalog-40 and 9 cc of 0.25% Sensorcaine with epinephrine before the patient was placed into a sling. The patient was then awakened and returned to the recovery room in satisfactory condition after tolerating the procedure well.

## 2017-09-10 NOTE — Anesthesia Preprocedure Evaluation (Signed)
Anesthesia Evaluation  Patient identified by MRN, date of birth, ID band Patient awake    Reviewed: Allergy & Precautions, H&P , NPO status , Patient's Chart, lab work & pertinent test results, reviewed documented beta blocker date and time   Airway Mallampati: II  TM Distance: >3 FB Neck ROM: full    Dental no notable dental hx.    Pulmonary neg pulmonary ROS,    Pulmonary exam normal breath sounds clear to auscultation       Cardiovascular Exercise Tolerance: Good hypertension,  Rhythm:regular Rate:Normal     Neuro/Psych negative neurological ROS  negative psych ROS   GI/Hepatic negative GI ROS, Neg liver ROS,   Endo/Other  diabetes, Insulin Dependent  Renal/GU negative Renal ROS  negative genitourinary   Musculoskeletal  (+) Arthritis ,   Abdominal   Peds  Hematology negative hematology ROS (+)   Anesthesia Other Findings   Reproductive/Obstetrics negative OB ROS                             Anesthesia Physical Anesthesia Plan  ASA: III  Anesthesia Plan: General   Post-op Pain Management:    Induction:   PONV Risk Score and Plan:   Airway Management Planned:   Additional Equipment:   Intra-op Plan:   Post-operative Plan:   Informed Consent: I have reviewed the patients History and Physical, chart, labs and discussed the procedure including the risks, benefits and alternatives for the proposed anesthesia with the patient or authorized representative who has indicated his/her understanding and acceptance.   Dental Advisory Given  Plan Discussed with: CRNA  Anesthesia Plan Comments:         Anesthesia Quick Evaluation

## 2017-09-10 NOTE — Anesthesia Postprocedure Evaluation (Signed)
Anesthesia Post Note  Patient: Nathan Chan  Procedure(s) Performed: Julaine Hua MANIPULATION SHOULDER WITH POSSIBLE STEROID INJECTION (Bilateral )  Patient location during evaluation: PACU Anesthesia Type: General Level of consciousness: awake and alert Pain management: pain level controlled Vital Signs Assessment: post-procedure vital signs reviewed and stable Respiratory status: spontaneous breathing, nonlabored ventilation, respiratory function stable and patient connected to nasal cannula oxygen Cardiovascular status: blood pressure returned to baseline and stable Postop Assessment: no apparent nausea or vomiting Anesthetic complications: no    Alisa Graff

## 2018-02-13 IMAGING — CT CT BIOPSY
2 series · 10 of 14 positions shown, 11 images · non-contrast
Comparison: none

CLINICAL DATA: Expansile lucent nonaggressive appearing right ninth
rib lesion, present since 5269. Regional pain.

[Series 3: i-spiral 5.0 b30f · axial · 0.71mm/px · z∈[-228,-140]mm · 4 of 43 slices shown, 5 images]
[im 9/43  soft-tissue]
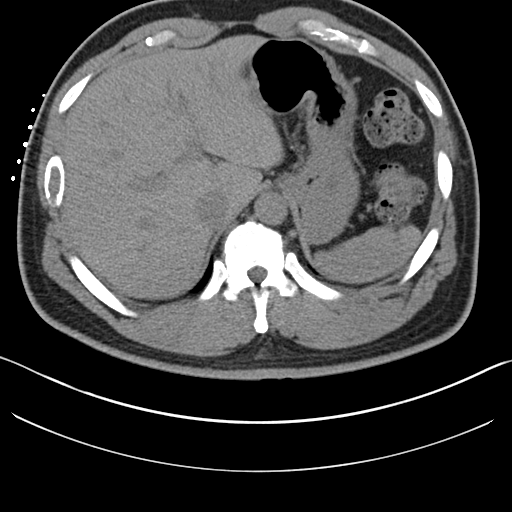
[im 9/43  bone]
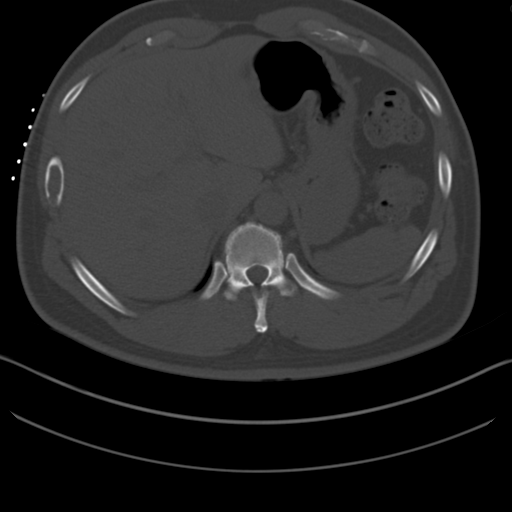
[im 17/43  bone]
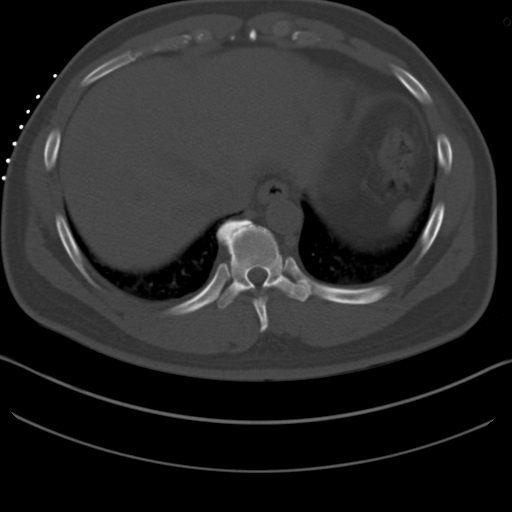
[im 26/43  bone]
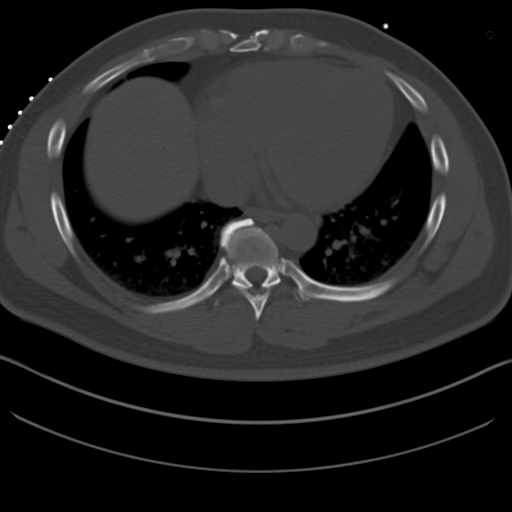
[im 34/43  bone]
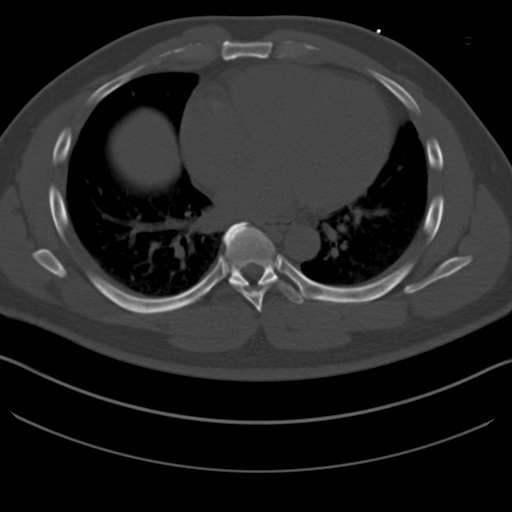

[Series 4: i-sequence 4.8 b30s · axial · 0.77mm/px · z∈[-294,-284]mm · 6 of 63 slices shown]
[im 9/63  bone]
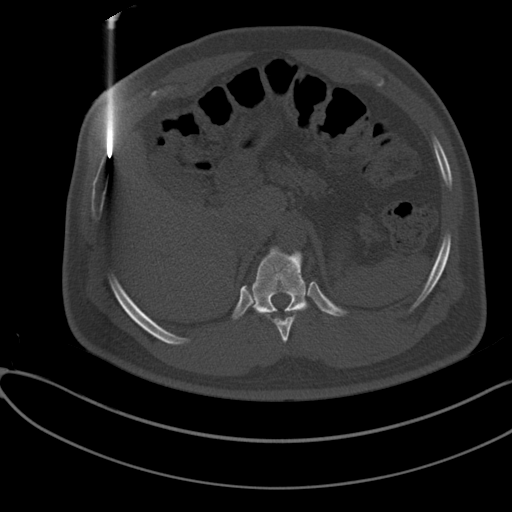
[im 18/63  bone]
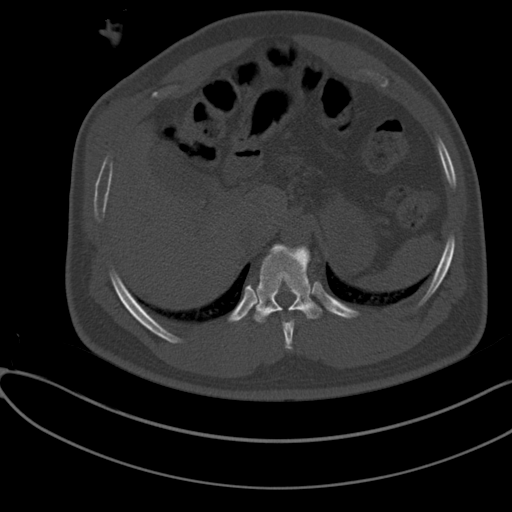
[im 27/63  bone]
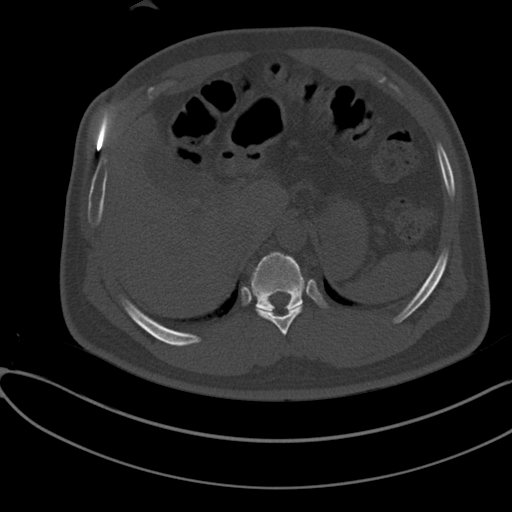
[im 36/63  bone]
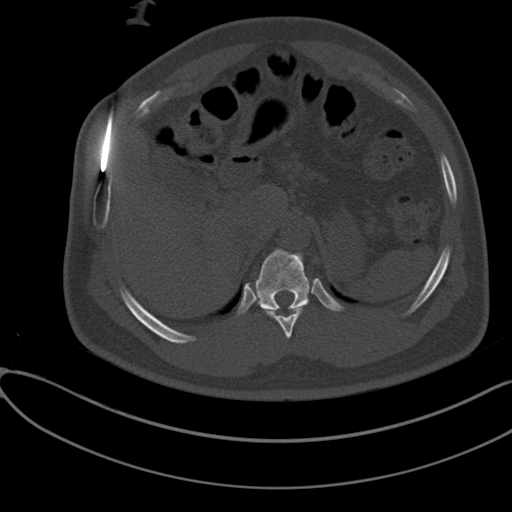
[im 45/63  bone]
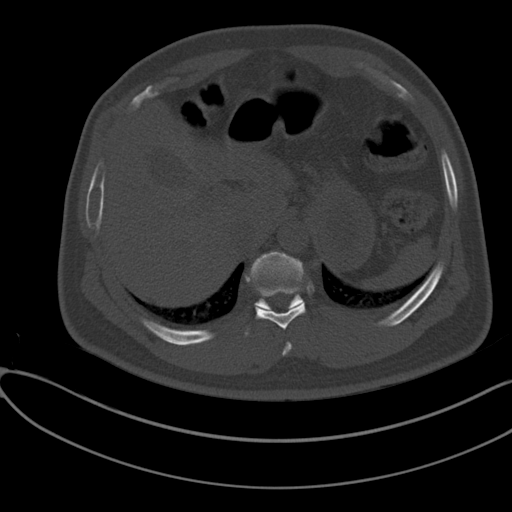
[im 54/63  bone]
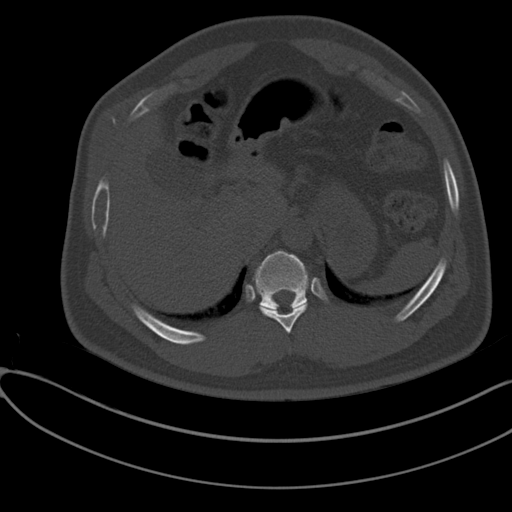

[10 of 14 positions shown; findings below may reference images not displayed]

EXAM:
CT GUIDED CORE BIOPSY OF RIGHT NINTH RIB LESION

ANESTHESIA/SEDATION:
Intravenous Fentanyl and Versed were administered as conscious
sedation during continuous monitoring of the patient's level of
consciousness and physiological / cardiorespiratory status by the
radiology RN, with a total moderate sedation time of 33 minutes.

PROCEDURE:
The procedure risks, benefits, and alternatives were explained to
the patient. Questions regarding the procedure were encouraged and
answered. The patient understands and consents to the procedure.

Select axial scans through the thorax were obtained. The expansile
right ninth rib lesion was localized and an appropriate skin entry
site determined and marked.

The operative field was prepped with chlorhexidinein a sterile
fashion, and a sterile drape was applied covering the operative
field. A sterile gown and sterile gloves were used for the
procedure. Local anesthesia was provided with 1% Lidocaine.

Under CT fluoroscopic guidance, an 11 gauge cook trocar bone needle
was advanced into the lesion for core biopsy sample x2.
Postprocedure scan shows no fracture or other apparent complication.
The patient tolerated the procedure well.

COMPLICATIONS:
None immediate
FINDINGS: Lucent expansile lesion of the lateral aspect right ninth rib was
again localized. Core biopsy samples obtained as above.
IMPRESSION: 1. Technically successful CT-guided core biopsy of right ninth rib
bone lesion.

## 2020-08-26 ENCOUNTER — Other Ambulatory Visit: Payer: Self-pay

## 2020-08-26 ENCOUNTER — Emergency Department: Payer: BLUE CROSS/BLUE SHIELD

## 2020-08-26 ENCOUNTER — Emergency Department
Admission: EM | Admit: 2020-08-26 | Discharge: 2020-08-26 | Disposition: A | Discharge status: Home / Self Care | Payer: BLUE CROSS/BLUE SHIELD | Attending: Emergency Medicine | Admitting: Emergency Medicine

## 2020-08-26 DIAGNOSIS — Z85830 Personal history of malignant neoplasm of bone: Secondary | ICD-10-CM | POA: Insufficient documentation

## 2020-08-26 DIAGNOSIS — Z7951 Long term (current) use of inhaled steroids: Secondary | ICD-10-CM | POA: Diagnosis not present

## 2020-08-26 DIAGNOSIS — Z79899 Other long term (current) drug therapy: Secondary | ICD-10-CM | POA: Diagnosis not present

## 2020-08-26 DIAGNOSIS — E103599 Type 1 diabetes mellitus with proliferative diabetic retinopathy without macular edema, unspecified eye: Secondary | ICD-10-CM | POA: Insufficient documentation

## 2020-08-26 DIAGNOSIS — I10 Essential (primary) hypertension: Secondary | ICD-10-CM | POA: Insufficient documentation

## 2020-08-26 DIAGNOSIS — U071 COVID-19: Secondary | ICD-10-CM | POA: Insufficient documentation

## 2020-08-26 DIAGNOSIS — Z7982 Long term (current) use of aspirin: Secondary | ICD-10-CM | POA: Diagnosis not present

## 2020-08-26 DIAGNOSIS — Z794 Long term (current) use of insulin: Secondary | ICD-10-CM | POA: Insufficient documentation

## 2020-08-26 DIAGNOSIS — R059 Cough, unspecified: Secondary | ICD-10-CM | POA: Diagnosis present

## 2020-08-26 LAB — CBC
HCT: 46.3 % (ref 39.0–52.0)
Hemoglobin: 15 g/dL (ref 13.0–17.0)
MCH: 28 pg (ref 26.0–34.0)
MCHC: 32.4 g/dL (ref 30.0–36.0)
MCV: 86.5 fL (ref 80.0–100.0)
Platelets: 146 10*3/uL — ABNORMAL LOW (ref 150–400)
RBC: 5.35 MIL/uL (ref 4.22–5.81)
RDW: 14.1 % (ref 11.5–15.5)
WBC: 2.6 10*3/uL — ABNORMAL LOW (ref 4.0–10.5)
nRBC: 0 % (ref 0.0–0.2)

## 2020-08-26 LAB — BASIC METABOLIC PANEL
Anion gap: 11 (ref 5–15)
BUN: 16 mg/dL (ref 6–20)
CO2: 25 mmol/L (ref 22–32)
Calcium: 8.7 mg/dL — ABNORMAL LOW (ref 8.9–10.3)
Chloride: 102 mmol/L (ref 98–111)
Creatinine, Ser: 1.26 mg/dL — ABNORMAL HIGH (ref 0.61–1.24)
GFR, Estimated: >60 mL/min (ref >60)
Glucose, Bld: 241 mg/dL — ABNORMAL HIGH (ref 70–99)
Potassium: 4 mmol/L (ref 3.5–5.1)
Sodium: 138 mmol/L (ref 135–145)

## 2020-08-26 LAB — TROPONIN I (HIGH SENSITIVITY): Troponin I (High Sensitivity): 9 ng/L (ref <18)

## 2020-08-26 NOTE — ED Triage Notes (Signed)
Pt states tested COVID + today. C/o R sided CP. Denies congestion, fever. Cough present. A&O, speaking in complete sentences.

## 2020-08-26 NOTE — ED Provider Notes (Signed)
[facility] Emergency Department Provider Note  ____________________________________________   Event Date/Time   First MD Initiated Contact with Patient 08/26/20 1543     (approximate)  I have reviewed the triage vital signs and the nursing notes.   HISTORY  Chief Complaint Covid Positive and Chest Pain    HPI Nathan Chan is a 50 y.o. male presents to the emergency department with URI symptoms for 4 days.   Is complaining of cough, denies congestion, fever, chills, complains of chest pain, complains of shortness of breath denies close contact with Covid19+ patient, patient is not vaccinated.   Past Medical History:  Diagnosis Date  . Diabetes mellitus without complication ([place])    type 1  . Hyperlipidemia   . Hypertension                                   controlled  on medication  . Meningitis 2015  . Shoulder pain, bilateral     Patient Active Problem List   Diagnosis Date Noted  . Rib lesion 02/28/2016  . 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 Erroneous encounter - disregard 02/19/2016  . Diabetes mellitus type I ([place]) 02/13/2016  . 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 Colitis 01/22/2016  . Albuminuria 09/28/2015  . Proliferative diabetic retinopathy ([place]) 09/06/2015  . 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/12/2015  . 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 Essential hypertension 06/08/2015  . Porokeratosis 05/21/2015  . Rotator cuff tendonitis 04/17/2015  . Corn of foot 04/01/2014  . Thumb laceration 04/01/2014  . Meningitis 12/20/2013  . IP (interphalangeal) joint, sprain, hand 06/21/2013  . Closed fracture of middle or proximal phalanx or phalanges of hand 05/16/2013    Past Surgical History:  Procedure Laterality Date  . EYE SURGERY Right 09/2015   retinal detachment  . EYE SURGERY     eyelid surgery  . SHOULDER CLOSED REDUCTION Bilateral 09/10/2017   Procedure: BLALTERAL MANIPULATION SHOULDER WITH POSSIBLE STEROID  INJECTION;  Surgeon: Corky Mull, MD;  Location: [place];  Service: Orthopedics;  Laterality: Bilateral;  Diabetic - insulin    Prior to Admission medications   Medication Sig Start Date End Date Taking? Authorizing Provider  acetaminophen (TYLENOL) 325 MG tablet Take 650 mg by mouth every 6 (six) hours as needed.    [provider]  amLODipine (NORVASC) 10 MG tablet Take 10 mg by mouth daily.    [provider]  aspirin 81 MG tablet Take 81 mg by mouth daily.    [provider]  atorvastatin (LIPITOR) 20 MG tablet Take 20 mg by mouth daily.    [provider]  budesonide-formoterol (SYMBICORT) 160-4.5 MCG/ACT inhaler Inhale 2 puffs into the lungs 2 (two) times daily.    [provider]  diphenhydramine-acetaminophen (TYLENOL PM) 25-500 MG TABS tablet Take 1 tablet by mouth at bedtime as needed.    [provider]  insulin aspart (NOVOLOG FLEXPEN) 100 UNIT/ML FlexPen Inject 15 Units into the skin 3 (three) times daily with meals. 15 units before lunch and supper    [provider]  insulin aspart protamine - aspart (NOVOLOG 70/30 MIX) (70-30) 100 UNIT/ML FlexPen 25units BID 01/05/15   [provider]  insulin glargine (LANTUS) 100 UNIT/ML injection Inject 30 Units into the skin at bedtime.     [provider]  lisinopril (PRINIVIL,ZESTRIL) 20 MG tablet Take 40 mg by mouth daily.  [provider]  meloxicam (MOBIC) 15 MG tablet Take 15 mg by mouth daily.    [provider]  metoprolol succinate (TOPROL-XL) 25 MG 24 hr tablet Take 25 mg by mouth 2 (two) times daily.    [provider]  oxycodone (OXY-IR) 5 MG capsule Take 1-2 capsules (5-10 mg total) by mouth every 4 (four) hours as needed. 09/10/17   Poggi, Marshall Cork, MD  oxycodone (OXY-IR) 5 MG capsule Take 1 capsule (5 mg total) by mouth every 4 (four) hours as needed. 09/10/17   Poggi, Marshall Cork, MD  polyethylene glycol (MIRALAX /  Floria Raveling) packet Take 17 g by mouth daily. Mix one tablespoon with 8oz of your favorite juice or water every day until you are having soft formed stools. Then start taking once daily if you didn't have a stool the day before. 04/03/17   Merlyn Lot, MD  sildenafil (VIAGRA) 50 MG tablet Take 50 mg by mouth daily as needed for erectile dysfunction.    [provider]    Allergies Patient has no known allergies.  Family History  Problem Relation Age of Onset  . Stroke Mother   . Diabetes Mother   . Hypertension Mother   . Stroke Father   . Diabetes Father   . Hypertension Father   . Stroke Sister   . Hypertension Maternal Grandmother   . Hypertension Paternal Grandmother     Social History Social History   Tobacco Use  . Smoking status: Never Smoker  . Smokeless tobacco: Never Used  Substance Use Topics  . Alcohol use: Yes    Alcohol/week: 0.0 standard drinks    Comment: Rarely  . Drug use: No    Review of Systems  Constitutional: Denies fever/chills Eyes: No visual changes. ENT: Denies sore throat. Respiratory: Positive cough Cardiovascular: Positive chest pain Gastrointestinal: Denies abdominal pain Genitourinary: Negative for dysuria. Musculoskeletal: Negative for back pain. Skin: Negative for rash. Neurological: Denies neurological changes    ____________________________________________   PHYSICAL EXAM:  VITAL SIGNS: ED Triage Vitals  Enc Vitals Group     BP 08/26/20 1451 (!) 141/81     Pulse Rate 08/26/20 1451 88     Resp 08/26/20 1451 18     Temp 08/26/20 1451 99 F (37.2 C)     Temp Source 08/26/20 1451 Oral     SpO2 08/26/20 1451 95 %     Weight 08/26/20 1450 218 lb (98.9 kg)     Height 08/26/20 1450 6' (1.829 m)     Head Circumference --      Peak Flow --      Pain Score 08/26/20 1450 8     Pain Loc --      Pain Edu? --      Excl. in [place]? --     Constitutional: Alert and oriented. Well appearing and in no acute  distress. Eyes: Conjunctivae are normal.  Head: Atraumatic. Nose: No congestion/rhinnorhea. Mouth/Throat: Mucous membranes are moist.   Neck:  supple no lymphadenopathy noted Cardiovascular: Normal rate, regular rhythm. Heart sounds are normal Respiratory: Normal respiratory effort.  No retractions, lungs CTA GU: deferred Musculoskeletal: FROM all extremities, warm and well perfused Neurologic:  Normal speech and language.  Skin:  Skin is warm, dry and intact. No rash noted. Psychiatric: Mood and affect are normal. Speech and behavior are normal.  ____________________________________________   LABS (all labs ordered are listed, but only abnormal results are displayed)  Labs Reviewed  BASIC METABOLIC PANEL - Abnormal;  Notable for the following components:      Result Value   Glucose, Bld 241 (*)    Creatinine, Ser 1.26 (*)    Calcium 8.7 (*)    All other components within normal limits  CBC - Abnormal; Notable for the following components:   WBC 2.6 (*)    Platelets 146 (*)    All other components within normal limits  TROPONIN I (HIGH SENSITIVITY)  TROPONIN I (HIGH SENSITIVITY)   ____________________________________________   ____________________________________________  RADIOLOGY  Chest x-ray  ____________________________________________   PROCEDURES  Procedure(s) performed: No  Procedures    ____________________________________________   INITIAL IMPRESSION / ASSESSMENT AND PLAN / ED COURSE  Pertinent labs & imaging results that were available during my care of the patient were reviewed by me and considered in my medical decision making (see chart for details).   Patient is a 50 year old male with history of diabetes type 1, hypertension, hyperlipidemia who complains of URI symptoms.  Exam is consistent with covid.  Patient was seen at urgent care and instructed to come to the ER because he was complaining of chest discomfort.  Had a positive Covid test  while at the urgent care.  Patient is not vaccinated  [facility]  CBC has depressed WBC was 2.6, basic metabolic panel shows glucose to be elevated 241, troponin is normal  Chest x-ray reviewed by me and confirmed by radiology does not show COVID pneumonia.  EKG, see physician read shows normal sinus rhythm  I did explain all findings to the patient.  Told him I give him a referral to the Covid infusion center.  They will see if he qualifies and call him.  Due to him being unvaccinated with comorbidities I feel the most likely will call him for monoclonal antibodies.  Patient is taking over-the-counter measures.  Return emergency department worsening.  He was discharged in stable condition.  The patient was instructed to quarantine themselves at home.     Nathan Chan was evaluated in Emergency Department on 08/26/2020 for the symptoms described in the history of present illness. He was evaluated in the context of the global COVID-19 pandemic, which necessitated consideration that the patient might be at risk for infection with the SARS-CoV-2 virus that causes COVID-19. Institutional protocols and algorithms that pertain to the evaluation of patients at risk for COVID-19 are in a state of rapid change based on information released by regulatory bodies including the CDC and federal and state organizations. These policies and algorithms were followed during the patient's care in the ED.   As part of my medical decision making, I reviewed the following data within the [place] notes reviewed and incorporated, Labs reviewed , EKG interpreted NSR and see physician read, Old chart reviewed, Radiograph reviewed , Notes from prior ED visits and [place] Controlled Substance Database  ____________________________________________   FINAL CLINICAL IMPRESSION(S) / ED DIAGNOSES  Final diagnoses:  COVID      NEW MEDICATIONS STARTED DURING THIS  VISIT:  Discharge Medication List as of 08/26/2020  3:53 PM       Note:  This document was prepared using Dragon voice recognition software and may include unintentional dictation errors.    Versie Starks, PA-C 08/26/20 1639    Carrie Mew, MD 08/27/20 [phone]

## 2020-08-26 NOTE — ED Notes (Signed)
First Nurse Note: Pt to ED via [facility] from Urgent care for chest pain 8/10. Urgent care EKG showed depression. Pt positive for COVID today. CBG 302

## 2020-08-26 NOTE — Discharge Instructions (Addendum)

## 2020-08-27 ENCOUNTER — Other Ambulatory Visit: Payer: Self-pay | Admitting: Physician Assistant

## 2020-08-27 DIAGNOSIS — I10 Essential (primary) hypertension: Secondary | ICD-10-CM

## 2020-08-27 DIAGNOSIS — E1069 Type 1 diabetes mellitus with other specified complication: Secondary | ICD-10-CM

## 2020-08-27 DIAGNOSIS — U071 COVID-19: Secondary | ICD-10-CM

## 2020-08-27 NOTE — Progress Notes (Signed)
I connected by phone with Nathan Chan on 08/27/2020 at 9:41 AM to discuss the potential use of a new treatment for mild to moderate COVID-19 viral infection in non-hospitalized patients.  This patient is a 50 y.o. male that meets the FDA criteria for Emergency Use Authorization of COVID monoclonal antibody sotrovimab.Marland Kitchen  Has a (+) direct SARS-CoV-2 viral test result  Has mild or moderate COVID-19   Is NOT hospitalized due to COVID-19  Is within 10 days of symptom onset  Has at least one of the high risk factor(s) for progression to severe COVID-19 and/or hospitalization as defined in EUA.  Specific high risk criteria : BMI > 25, Diabetes, Cardiovascular disease or hypertension and Other high risk medical condition per CDC:  unvaccinated   I have spoken and communicated the following to the patient or parent/caregiver regarding COVID monoclonal antibody treatment:  1. FDA has authorized the emergency use for the treatment of mild to moderate COVID-19 in adults and pediatric patients with positive results of direct SARS-CoV-2 viral testing who are 32 years of age and older weighing at least 40 kg, and who are at high risk for progressing to severe COVID-19 and/or hospitalization.  2. The significant known and potential risks and benefits of COVID monoclonal antibody, and the extent to which such potential risks and benefits are unknown.  3. Information on available alternative treatments and the risks and benefits of those alternatives, including clinical trials.  4. Patients treated with COVID monoclonal antibody should continue to self-isolate and use infection control measures (e.g., wear mask, isolate, social distance, avoid sharing personal items, clean and disinfect "high touch" surfaces, and frequent handwashing) according to CDC guidelines.   5. The patient or parent/caregiver has the option to accept or refuse COVID monoclonal antibody treatment.  After reviewing this information  with the patient, the patient has agreed to receive one of the available covid 19 monoclonal antibodies and will be provided an appropriate fact sheet prior to infusion.  Sx onset 2/16. Set up for infusion on 2/21 @ 10:30am. Directions given to [facility]. Pt is aware that insurance will be charged an infusion fee. Pt is unvaccinated.   Angelena Form 08/27/2020 9:41 AM

## 2020-08-28 ENCOUNTER — Ambulatory Visit (HOSPITAL_COMMUNITY)
Admission: RE | Admit: 2020-08-28 | Discharge: 2020-08-28 | Disposition: A | Discharge status: Home / Self Care | Payer: BLUE CROSS/BLUE SHIELD | Source: Ambulatory Visit | Attending: Pulmonary Disease | Admitting: Pulmonary Disease

## 2020-08-28 DIAGNOSIS — E1069 Type 1 diabetes mellitus with other specified complication: Secondary | ICD-10-CM | POA: Diagnosis not present

## 2020-08-28 DIAGNOSIS — I10 Essential (primary) hypertension: Secondary | ICD-10-CM

## 2020-08-28 DIAGNOSIS — U071 COVID-19: Secondary | ICD-10-CM | POA: Diagnosis not present

## 2020-08-28 MED ORDER — FAMOTIDINE IN NACL 20-0.9 MG/50ML-% IV SOLN: 20.0000 mg | Freq: Once | INTRAVENOUS | Status: DC | PRN

## 2020-08-28 MED ORDER — SOTROVIMAB 500 MG/8ML IV SOLN
500.0000 mg | Freq: Once | INTRAVENOUS | Status: AC
  Administered 2020-08-28: 500 mg via INTRAVENOUS

## 2020-08-28 MED ORDER — EPINEPHRINE 0.3 MG/0.3ML IJ SOAJ: 0.3000 mg | Freq: Once | INTRAMUSCULAR | Status: DC | PRN

## 2020-08-28 MED ORDER — ALBUTEROL SULFATE HFA 108 (90 BASE) MCG/ACT IN AERS: 2.0000 | INHALATION_SPRAY | Freq: Once | RESPIRATORY_TRACT | Status: DC | PRN

## 2020-08-28 MED ORDER — METHYLPREDNISOLONE SODIUM SUCC 125 MG IJ SOLR: 125.0000 mg | Freq: Once | INTRAMUSCULAR | Status: DC | PRN

## 2020-08-28 MED ORDER — SODIUM CHLORIDE 0.9 % IV SOLN: INTRAVENOUS | Status: DC | PRN

## 2020-08-28 MED ORDER — DIPHENHYDRAMINE HCL 50 MG/ML IJ SOLN: 50.0000 mg | Freq: Once | INTRAMUSCULAR | Status: DC | PRN

## 2020-08-28 NOTE — Progress Notes (Signed)
Patient reviewed Fact Sheet for Patients, Parents, and Caregivers for Emergency Use Authorization (EUA) of sotrovimab for the Treatment of Coronavirus. Patient also reviewed and is agreeable to the estimated cost of treatment. Patient is agreeable to proceed.   

## 2020-08-28 NOTE — Progress Notes (Signed)
Diagnosis: COVID-19  Physician: Dr. Patrick Wright  Procedure: Covid Infusion Clinic Med: Sotrovimab infusion - Provided patient with sotrovimab fact sheet for patients, parents, and caregivers prior to infusion.   Complications: No immediate complications noted  Discharge: Discharged home    

## 2020-08-28 NOTE — Discharge Instructions (Signed)
10 Things You Can Do to Manage Your COVID-19 Symptoms at Home If you have possible or confirmed COVID-19: 1. Stay home except to get medical care. 2. Monitor your symptoms carefully. If your symptoms get worse, call your healthcare provider immediately. 3. Get rest and stay hydrated. 4. If you have a medical appointment, call the healthcare provider ahead of time and tell them that you have or may have COVID-19. 5. For medical emergencies, call 911 and notify the dispatch personnel that you have or may have COVID-19. 6. Cover your cough and sneezes with a tissue or use the inside of your elbow. 7. Wash your hands often with soap and water for at least 20 seconds or clean your hands with an alcohol-based hand sanitizer that contains at least 60% alcohol. 8. As much as possible, stay in a specific room and away from other people in your home. Also, you should use a separate bathroom, if available. If you need to be around other people in or outside of the home, wear a mask. 9. Avoid sharing personal items with other people in your household, like dishes, towels, and bedding. 10. Clean all surfaces that are touched often, like counters, tabletops, and doorknobs. Use household cleaning sprays or wipes according to the label instructions. cdc.gov/coronavirus 01/21/2020 This information is not intended to replace advice given to you by your health care provider. Make sure you discuss any questions you have with your health care provider. Document Revised: 05/08/2020 Document Reviewed: 05/08/2020 Elsevier Patient Education  2021 Elsevier Inc.  What types of side effects do monoclonal antibody drugs cause?  Common side effects  In general, the more common side effects caused by monoclonal antibody drugs include: . Allergic reactions, such as hives or itching . Flu-like signs and symptoms, including chills, fatigue, fever, and muscle aches and pains . Nausea, vomiting . Diarrhea . Skin  rashes . Low blood pressure  If you have any questions or concerns after the infusion please call the Advanced Practice Provider on call at [phone]. This number is ONLY intended for your use regarding questions or concerns about the infusion post-treatment side-effects.  Please do not provide this number to others for use. For return to work notes please contact your primary care provider.   If someone you know is interested in receiving treatment please have them contact their MD for a referral or visit [email or web address]   

## 2021-08-20 ENCOUNTER — Emergency Department: Payer: BLUE CROSS/BLUE SHIELD

## 2021-08-20 ENCOUNTER — Emergency Department
Admission: EM | Admit: 2021-08-20 | Discharge: 2021-08-20 | Disposition: A | Discharge status: Home / Self Care | Payer: BLUE CROSS/BLUE SHIELD | Attending: Emergency Medicine | Admitting: Emergency Medicine

## 2021-08-20 ENCOUNTER — Other Ambulatory Visit: Payer: Self-pay

## 2021-08-20 DIAGNOSIS — R079 Chest pain, unspecified: Secondary | ICD-10-CM

## 2021-08-20 DIAGNOSIS — E119 Type 2 diabetes mellitus without complications: Secondary | ICD-10-CM | POA: Diagnosis not present

## 2021-08-20 DIAGNOSIS — R0789 Other chest pain: Secondary | ICD-10-CM | POA: Insufficient documentation

## 2021-08-20 DIAGNOSIS — I10 Essential (primary) hypertension: Secondary | ICD-10-CM | POA: Insufficient documentation

## 2021-08-20 LAB — CBC
HCT: 48 % (ref 39.0–52.0)
Hemoglobin: 15.2 g/dL (ref 13.0–17.0)
MCH: 27.9 pg (ref 26.0–34.0)
MCHC: 31.7 g/dL (ref 30.0–36.0)
MCV: 88.1 fL (ref 80.0–100.0)
Platelets: 214 10*3/uL (ref 150–400)
RBC: 5.45 MIL/uL (ref 4.22–5.81)
RDW: 14 % (ref 11.5–15.5)
WBC: 5.3 10*3/uL (ref 4.0–10.5)
nRBC: 0 % (ref 0.0–0.2)

## 2021-08-20 LAB — TROPONIN I (HIGH SENSITIVITY)
Troponin I (High Sensitivity): 11 ng/L (ref <18)
Troponin I (High Sensitivity): 11 ng/L (ref <18)

## 2021-08-20 LAB — BASIC METABOLIC PANEL
Anion gap: 7 (ref 5–15)
BUN: 17 mg/dL (ref 6–20)
CO2: 26 mmol/L (ref 22–32)
Calcium: 9.7 mg/dL (ref 8.9–10.3)
Chloride: 106 mmol/L (ref 98–111)
Creatinine, Ser: 1.26 mg/dL — ABNORMAL HIGH (ref 0.61–1.24)
GFR, Estimated: >60 mL/min (ref >60)
Glucose, Bld: 105 mg/dL — ABNORMAL HIGH (ref 70–99)
Potassium: 4.3 mmol/L (ref 3.5–5.1)
Sodium: 139 mmol/L (ref 135–145)

## 2021-08-20 NOTE — ED Provider Triage Note (Signed)
Emergency Medicine Provider Triage Evaluation Note  Breck Maryland , a 51 y.o. male  was evaluated in triage.  Pt complains of central chest pain with onset while at work.  Patient works at [employer] occupational, and noted central chest pain with exertion.  He notes onset 30 minutes prior to arrival  Review of Systems  Positive: CP Negative: NV, SOB  Physical Exam  BP (!) 154/102    Pulse 81    Temp 98 F (36.7 C)    Resp 18    SpO2 96%  Gen:   Awake, no distress  CTA Resp:  Normal effort  MSK:   Moves extremities without difficulty  Other:  CVS: RRR  Medical Decision Making  Medically screening exam initiated at 4:47 PM.  Appropriate orders placed.  Trase Bunda was informed that the remainder of the evaluation will be completed by another provider, this initial triage assessment does not replace that evaluation, and the importance of remaining in the ED until their evaluation is complete.  Patient to the ED for evaluation of central chest pain that started about 30 minutes prior to arrival while at work.   Melvenia Needles, PA-C 08/20/21 1702

## 2021-08-20 NOTE — ED Provider Notes (Signed)
[facility] Provider Note    Event Date/Time   First MD Initiated Contact with Patient 08/20/21 1659     (approximate)   History   Chest Pain   HPI  Nathan Chan is a 51 y.o. male past medical history of diabetes, hypertension hyperlipidemia presents with chest pain.  Patient symptoms started while he was at work.  He was just standing was not doing anything exertional.  Pain is located in the left side of his chest described as a tightness.  It was severe at onset and did not radiate.  It was no associated shortness of breath nausea or diaphoresis.  Patient denies any exertional component to his symptoms.  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  Unclear if he may have strained a muscle in his chest does not recall any precipitating event.  Pain has significantly eased off at the time of the evaluation.  He has no history of similar.  No history of coronary disease or prior MI.The patient denies hx of prior DVT/PE, hormone use, recent surgery, hx of cancer, prolonged immobilization, or hemoptysis.  Patient notes that the left lower extremity is chronically swollen, when he works he says he puts most of his weight on that side and thinks that that is why.  The swelling goes down when he is supine overnight.    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   Colitis 01/22/2016   Albuminuria 09/28/2015   Proliferative diabetic retinopathy ([place]) 09/06/2015   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/12/2015   Vasculogenic erectile dysfunction  06/12/2015   Essential hypertension 06/08/2015   Porokeratosis 05/21/2015   Rotator cuff tendonitis 04/17/2015   Corn of foot 04/01/2014   Thumb laceration 04/01/2014   Meningitis 12/20/2013   IP (interphalangeal) joint, sprain, hand 06/21/2013   Closed fracture of middle or proximal phalanx or phalanges of hand 05/16/2013     Physical Exam  Triage Vital Signs: ED Triage Vitals  Enc Vitals Group     BP 08/20/21 1441 (!) 154/102     Pulse Rate 08/20/21 1441 81     Resp 08/20/21 1441 18     Temp 08/20/21 1441 98 F (36.7 C)     Temp src --      SpO2 08/20/21 1441 96 %     Weight 08/20/21 1729 218 lb 0.6 oz (98.9 kg)     Height 08/20/21 1729 6' (1.829 m)     Head Circumference --      Peak Flow --      Pain Score 08/20/21 1445 8     Pain Loc --      Pain Edu? --      Excl. in [place]? --     Most recent vital signs: Vitals:   08/20/21 1441 08/20/21 1749  BP: (!) 154/102 (!) 150/99  Pulse: 81 78  Resp: 18 18  Temp: 98 F (36.7 C)   SpO2: 96% 97%     General: Awake, no distress.  CV:  Good peripheral perfusion.  Mild edema of the left lower extremity Resp:  Normal effort.  Abd:  No distention.  Neuro:             Awake, Alert, Oriented x 3  Other:     ED Results / Procedures / Treatments  Labs (all labs ordered are listed, but only abnormal results are displayed) Labs Reviewed  BASIC METABOLIC PANEL - Abnormal; Notable for the following components:      Result Value   Glucose, Bld 105 (*)    Creatinine, Ser 1.26 (*)    All other components within normal limits  CBC  TROPONIN I (HIGH SENSITIVITY)  TROPONIN I (HIGH SENSITIVITY)     EKG  EKG interpreted by myself, LVH with strain pattern in the lateral leads, similar to prior EKG, no acute ischemic changes, normal sinus rhythm, normal axis   RADIOLOGY I reviewed the CXR which does not show any acute cardiopulmonary process; agree with radiology report     PROCEDURES:  i   MEDICATIONS ORDERED IN  ED: Medications - No data to display   IMPRESSION / MDM / [place] / ED COURSE  I reviewed the triage vital signs and the nursing notes.                              Differential diagnosis includes, but is not limited to, angina, ACS, dissection, PE, musculoskeletal strain  This patient is a 51 year old male with history of hypertension who presents with chest pain that started earlier today.  Describes as tightness without associated symptoms.  It was not exertional.  Patient notes that it was significant at the time of onset and has since almost resolved.  Patient's vital signs are notable for hypertension otherwise within normal limits.  On exam he appears well.  He does have some asymmetric swelling in the left leg which she notes is chronic for him.  I reviewed his EKG which shows LVH with a strain pattern similar to prior EKGs.  His troponins x2 are negative.  Given his pain has significantly resolved I am less concerned for aortic dissection or PE.  With the 2 negative troponins we have ruled out NSTEMI, unstable angina certainly still possible, and will refer to cardiology.  I did discuss with the patient that if his pain returns and is not improving that he return to the emergency department for repeat evaluation.      FINAL CLINICAL IMPRESSION(S) / ED DIAGNOSES   Final diagnoses:  Chest pain, unspecified type     Rx / DC Orders   ED Discharge Orders     None        Note:  This document was prepared using Dragon voice recognition software and may include unintentional dictation errors.   Rada Hay, MD 08/20/21 [phone]

## 2021-08-20 NOTE — Discharge Instructions (Addendum)
Your blood work including your cardiac enzymes was reassuring.  Your EKG did not show signs of an acute heart attack.  We cannot say for sure that this is not your heart.  Would like you to see a cardiologist.  Please call to schedule an appointment.  If your pain comes back and worsens and is not improving, please return to the emergency department

## 2021-08-20 NOTE — ED Triage Notes (Signed)
Pt comes with c/o left sided CP that started about 30 minutes ago while at work. Pt states that it is sharp and shooting. Pt appears uncomfortable.

## 2023-02-28 ENCOUNTER — Emergency Department
Admission: EM | Admit: 2023-02-28 | Discharge: 2023-02-28 | Disposition: A | Discharge status: Home / Self Care | Payer: BLUE CROSS/BLUE SHIELD | Source: Home / Self Care | Attending: Emergency Medicine | Admitting: Emergency Medicine

## 2023-02-28 ENCOUNTER — Emergency Department: Payer: BLUE CROSS/BLUE SHIELD

## 2023-02-28 ENCOUNTER — Other Ambulatory Visit: Payer: Self-pay

## 2023-02-28 DIAGNOSIS — M7918 Myalgia, other site: Secondary | ICD-10-CM

## 2023-02-28 DIAGNOSIS — I10 Essential (primary) hypertension: Secondary | ICD-10-CM | POA: Diagnosis not present

## 2023-02-28 DIAGNOSIS — R079 Chest pain, unspecified: Secondary | ICD-10-CM | POA: Diagnosis present

## 2023-02-28 DIAGNOSIS — E119 Type 2 diabetes mellitus without complications: Secondary | ICD-10-CM | POA: Insufficient documentation

## 2023-02-28 DIAGNOSIS — R2991 Unspecified symptoms and signs involving the musculoskeletal system: Secondary | ICD-10-CM | POA: Diagnosis not present

## 2023-02-28 DIAGNOSIS — M25512 Pain in left shoulder: Secondary | ICD-10-CM | POA: Diagnosis not present

## 2023-02-28 LAB — CBC
HCT: 42.9 % (ref 39.0–52.0)
Hemoglobin: 13.9 g/dL (ref 13.0–17.0)
MCH: 27.9 pg (ref 26.0–34.0)
MCHC: 32.4 g/dL (ref 30.0–36.0)
MCV: 86.1 fL (ref 80.0–100.0)
Platelets: 251 10*3/uL (ref 150–400)
RBC: 4.98 MIL/uL (ref 4.22–5.81)
RDW: 14.4 % (ref 11.5–15.5)
WBC: 6.6 10*3/uL (ref 4.0–10.5)
nRBC: 0 % (ref 0.0–0.2)

## 2023-02-28 LAB — BASIC METABOLIC PANEL
Anion gap: 9 (ref 5–15)
BUN: 15 mg/dL (ref 6–20)
CO2: 27 mmol/L (ref 22–32)
Calcium: 8.8 mg/dL — ABNORMAL LOW (ref 8.9–10.3)
Chloride: 104 mmol/L (ref 98–111)
Creatinine, Ser: 1.15 mg/dL (ref 0.61–1.24)
GFR, Estimated: >60 mL/min (ref >60)
Glucose, Bld: 81 mg/dL (ref 70–99)
Potassium: 3.5 mmol/L (ref 3.5–5.1)
Sodium: 140 mmol/L (ref 135–145)

## 2023-02-28 LAB — TROPONIN I (HIGH SENSITIVITY)
Troponin I (High Sensitivity): 19 ng/L — ABNORMAL HIGH (ref <18)
Troponin I (High Sensitivity): 18 ng/L — ABNORMAL HIGH (ref <18)

## 2023-02-28 MED ORDER — TRAMADOL HCL 50 MG PO TABS: 50.0000 mg | ORAL_TABLET | Freq: Four times a day (QID) | ORAL | 0 refills | Status: AC | PRN

## 2023-02-28 MED ORDER — KETOROLAC TROMETHAMINE 30 MG/ML IJ SOLN
30.0000 mg | Freq: Once | INTRAMUSCULAR | Status: AC
  Administered 2023-02-28: 30 mg via INTRAVENOUS
  Filled 2023-02-28: qty 1

## 2023-02-28 NOTE — ED Provider Notes (Signed)
[facility] Provider Note    Event Date/Time   First MD Initiated Contact with Patient 02/28/23 1136     (approximate)  History   Chief Complaint: Chest Pain  HPI  Rhett Delhoyo is a 52 y.o. male with a past medical history of diabetes, hypertension, hyperlipidemia, presents to the emergency department for chest pain.  According to the patient approximately 1 to 2 hours prior to arrival he developed pain in his chest into his shoulder.  Patient states he continues to have some pain in the shoulder much worse with movement of the shoulder.  Denies any symptoms in the chest currently.  Denies any shortness of breath nausea or diaphoresis at any point.  No cardiac history per patient.  Patient denies any known injury to the shoulder.  Physical Exam   Triage Vital Signs: ED Triage Vitals  Encounter Vitals Group     BP 02/28/23 1041 (!) 124/94     Systolic BP Percentile --      Diastolic BP Percentile --      Pulse Rate 02/28/23 1041 78     Resp 02/28/23 1041 18     Temp 02/28/23 1041 98.7 F (37.1 C)     Temp Source 02/28/23 1041 Oral     SpO2 02/28/23 1041 99 %     Weight 02/28/23 1020 218 lb 0.6 oz (98.9 kg)     Height 02/28/23 1020 6' (1.829 m)     Head Circumference --      Peak Flow --      Pain Score 02/28/23 1020 7     Pain Loc --      Pain Education --      Exclude from Growth Chart --     Most recent vital signs: Vitals:   02/28/23 1041  BP: (!) 124/94  Pulse: 78  Resp: 18  Temp: 98.7 F (37.1 C)  SpO2: 99%    General: Awake, no distress.  CV:  Good peripheral perfusion.  Regular rate and rhythm  Resp:  Normal effort.  Equal breath sounds bilaterally.  Abd:  No distention.  Soft, nontender.  No rebound or guarding. Other:  Mild to moderate tenderness to palpation of the left shoulder.  Neurovascularly intact distally.   ED Results / Procedures / Treatments   EKG  EKG viewed and interpreted by myself shows a normal sinus rhythm  at 62 bpm with a narrow QRS, normal axis, normal intervals, nonspecific but no concerning ST changes.  RADIOLOGY  I have reviewed and interpreted chest x-ray images.  No consolidation on my evaluation. Radiology is read the x-ray is negative for acute disease.   MEDICATIONS ORDERED IN ED: Medications  ketorolac (TORADOL) 30 MG/ML injection 30 mg (30 mg Intravenous Given 02/28/23 1206)     IMPRESSION / MDM / ASSESSMENT AND PLAN / ED COURSE  I reviewed the triage vital signs and the nursing notes.  Patient's presentation is most consistent with acute presentation with potential threat to life or bodily function.  Patient presents emergency department for chest pain that started proxy 1 to 2 hours before arrival radiating to the left shoulder.  States the pain is worse with any movement of the left shoulder.  Patient has tenderness to the left shoulder.  Highly suspect more musculoskeletal pain although denies any known injury to the left shoulder.  Reassuringly chest x-ray shows no acute findings, EKG shows no acute findings.  Patient CBC is reassuring, chemistry is reassuring.  Patient's  troponin very slightly elevated at 19 although not significantly so.  Will repeat a troponin after 2 hours.  Patient denies any pleuritic pain.  As long as the patient's repeat troponin remains unchanged anticipate likely discharge home with NSAIDs for his shoulder pain.  We will dose Toradol while awaiting repeat troponin.  Repeat troponin is unchanged.  Given the patient's reassuring workup reproducibility of pain highly suspect more musculoskeletal symptoms.  Will discharge on a short course of tramadol have the patient follow-up with his doctor.  Discussed my typical return precautions.  FINAL CLINICAL IMPRESSION(S) / ED DIAGNOSES   Left shoulder pain Chest pain Musculoskeletal pain    Note:  This document was prepared using Dragon voice recognition software and may include unintentional dictation  errors.   Minna Antis, MD 02/28/23 1325

## 2023-02-28 NOTE — ED Notes (Signed)
First Nurse Note: Pt to ED via [facility] from work for chest pain. Pt states that the pain was sudden onset and lasted 30 minutes to 1 hour prior to pt calling EMS. Pt took 324 mg of Aspirin prior to ems arrival. Pt CBG 71, pt is diabetic.  18G RAC.   BP: 150/89

## 2023-02-28 NOTE — ED Triage Notes (Signed)
Pt here with cp that started 1 hour ago. Pt states pain is left sided and radiates to his arm. Pt describes the pain as sharp in nature. Pt states the pain is constant. Pt denies ND but endorses diarrhea.
